# Patient Record
Sex: Male | Born: 1935 | Race: White | Hispanic: No | Marital: Married | Smoking: Never smoker
Health system: Southern US, Community
[De-identification: ages and names within clinical notes are randomized; demographics above are authoritative.]

## PROBLEM LIST (undated history)

## (undated) DIAGNOSIS — N400 Enlarged prostate without lower urinary tract symptoms: Secondary | ICD-10-CM

## (undated) DIAGNOSIS — M1990 Unspecified osteoarthritis, unspecified site: Secondary | ICD-10-CM

## (undated) HISTORY — PX: EYE SURGERY: SHX253

---

## 1991-07-08 HISTORY — PX: AXILLARY LYMPH NODE BIOPSY: SHX5737

## 1998-11-18 ENCOUNTER — Emergency Department (HOSPITAL_COMMUNITY): Admission: EM | Admit: 1998-11-18 | Discharge: 1998-11-18 | Payer: Self-pay | Admitting: Emergency Medicine

## 2003-12-08 ENCOUNTER — Encounter: Admission: RE | Admit: 2003-12-08 | Discharge: 2003-12-08 | Payer: Self-pay | Admitting: Internal Medicine

## 2005-05-27 ENCOUNTER — Ambulatory Visit: Payer: Self-pay | Admitting: Gastroenterology

## 2005-06-04 ENCOUNTER — Ambulatory Visit: Payer: Self-pay | Admitting: Gastroenterology

## 2005-06-18 ENCOUNTER — Ambulatory Visit: Payer: Self-pay | Admitting: Gastroenterology

## 2006-05-08 ENCOUNTER — Ambulatory Visit (HOSPITAL_COMMUNITY): Admission: RE | Admit: 2006-05-08 | Discharge: 2006-05-08 | Payer: Self-pay | Admitting: Neurosurgery

## 2007-02-15 ENCOUNTER — Emergency Department (HOSPITAL_COMMUNITY): Admission: EM | Admit: 2007-02-15 | Discharge: 2007-02-15 | Payer: Self-pay | Admitting: Emergency Medicine

## 2008-06-14 ENCOUNTER — Encounter (INDEPENDENT_AMBULATORY_CARE_PROVIDER_SITE_OTHER): Payer: Self-pay | Admitting: Family Medicine

## 2008-06-14 ENCOUNTER — Ambulatory Visit: Payer: Self-pay | Admitting: Sports Medicine

## 2008-06-14 DIAGNOSIS — M7660 Achilles tendinitis, unspecified leg: Secondary | ICD-10-CM | POA: Insufficient documentation

## 2008-07-14 ENCOUNTER — Ambulatory Visit: Payer: Self-pay | Admitting: Sports Medicine

## 2008-09-07 ENCOUNTER — Ambulatory Visit: Payer: Self-pay | Admitting: Sports Medicine

## 2009-08-24 ENCOUNTER — Encounter: Admission: RE | Admit: 2009-08-24 | Discharge: 2009-08-24 | Payer: Self-pay | Admitting: Internal Medicine

## 2010-01-01 ENCOUNTER — Ambulatory Visit: Payer: Self-pay | Admitting: Family Medicine

## 2010-01-01 DIAGNOSIS — M79609 Pain in unspecified limb: Secondary | ICD-10-CM

## 2010-01-02 ENCOUNTER — Telehealth: Payer: Self-pay | Admitting: Family Medicine

## 2010-01-04 ENCOUNTER — Telehealth: Payer: Self-pay | Admitting: Family Medicine

## 2010-02-26 ENCOUNTER — Encounter: Admission: RE | Admit: 2010-02-26 | Discharge: 2010-02-26 | Payer: Self-pay | Admitting: Internal Medicine

## 2010-08-08 NOTE — Assessment & Plan Note (Signed)
Summary: Chad Gallegos,Chad Gallegos   Vital Signs:  Patient profile:   77 year old male BP sitting:   112 / 68  Vitals Entered By: Lillia Pauls CMA (January 01, 2010 2:54 PM)  History of Present Illness: patient is a very pleasant,, very active 51 room male, who currently is biking extensively, and also recently ran a half marathon several months ago.  He is a very fit individual, but now he complains primarily of left greater than right distal shin Gallegos.  This does not bother him when he is running, and also does not bother him when he is riding a bicycle. Essentially, this only bothers him when he is wearing his hiking boots. He also has recently bought a new para-boots, but noticed it more when he was wearing his old parrotlike induced, which he is had for approximately 10 years.  He has an upcoming significant trip in August going to [place] with his wife, and they're to be extensively hiking. He was recommended to go in to get some hiking boots for this trip.  REVIEW OF SYSTEMS  GEN: No systemic complaints, no fevers, chills, sweats, or other acute illnesses MSK: Detailed in the HPI GI: tolerating PO intake without difficulty Neuro: No numbness, parasthesias, or tingling associated. Otherwise the pertinent positives of the ROS are noted above.    Past History:  Past medical, surgical, family and social histories (including risk factors) reviewed, and no changes noted (except as noted below).  Past Medical History: history of Achilles tendinopathy, right  Family History: Reviewed history and no changes required.  Social History: Reviewed history and no changes required. married, retired Research officer, trade union professor [employer]  Physical Exam  General:  Well-developed,well-nourished,in no acute distress; alert,appropriate and cooperative throughout examination Head:  normocephalic and atraumatic.   Ears:  no external deformities.   Nose:  no external deformity.   Lungs:  normal respiratory effort.   Msk:   bilateral lower extremities: There is some palpable fullness, left greater than right in the distal tibia, just at the area proximal to  where his boot  with hand. This is tender to palpation and tender to percussion.  Hot test on the left is mildly tender, but he is able to complete a full 10 hops.  Negative Kluger test, negative squeeze test.  Nontender at the bilateral malleoli, minimally tender at the  right Achilles tendon.  Otherwise grossly the foot and ankle is normal.  He is able to dorsiflex and plantar flex as well as invert and evert his foot without any difficulty in both feet.   Impression & Recommendations:  Problem # 1:  LEG Gallegos, LEFT (ICD-729.5) Assessment New ultrasound evaluation, left leg: Mosca skeletal ultrasound probe, GE, logic E., was used to evaluate, and there was some evidence of some tissue swelling and edema present with fluid  adjacent to the bone and in the soft tissue adjacent. There was no evidence of cortical disruption.   Given clinical and radiological findings, it does make some sense that this could be a low-grade stress reaction,  accentuated by transmitted force to the tibia itself, after the patient  had increased  tensile demands due to  his hiking boots.  They less likely this could all be soft tissue irritation, however, think that is less likely.  Recommended icing and Voltaren gel. I think he can continue to bite and run as tolerated.  Recommended that he get a lower top  hiking shoe, and they do reasonably be able to  do  virtually any hiking,, since he is a highly conditioned athlete  Orders: Prescription Created Electronically [phone]) [place] LIMITED [phone])  Problem # 2:  LEG Gallegos, RIGHT (ICD-729.5) Assessment: New  Orders: [place] LIMITED ([id number])  Complete Medication List: 1)  Voltaren 1 % Gel (Diclofenac sodium) .... Apply to affected area 3-4 times daily. dispense qs 1 month Prescriptions: VOLTAREN 1 % GEL (DICLOFENAC SODIUM) apply to  affected area 3-4 times daily. dispense qs 1 month  #1 x 1   Entered and Authorized by:   Hannah Beat MD   Signed by:   Hannah Beat MD on 01/01/2010   Method used:   [facility] to        [facility]  [address] [phone]* (retail)       [address]       [place], [place]  [id number]       Ph: [phone] or [phone]       Fax: [phone]   RxID:   [id number]

## 2010-08-08 NOTE — Progress Notes (Signed)
Summary: prior auth denied for androgel  Phone Note From Pharmacy   Caller: [facility] Summary of Call: Prior auth denied for voltaren gel, letter is on your desk. Initial call taken by: Lowella Petties CMA,  January 04, 2010 5:10 PM  Follow-up for Phone Call        Can you call Chad Gallegos -- his Voltaren gel got denied by his insurance. One of the compounding pharmacies like [facility] can make some up, usually for around 40 dollars cash price if he would like. This is up to him. And we can call in.  [facility] patient. Follow-up by: Hannah Beat MD,  January 05, 2010 10:58 AM  Additional Follow-up for Phone Call Additional follow up Details #1::        left pt a mssg to call us back with the option of his choice Additional Follow-up by: Lillia Pauls CMA,  January 09, 2010 10:03 AM

## 2010-08-08 NOTE — Progress Notes (Signed)
Summary: prior Berkley Harvey is needed for voltaren gel  Phone Note From Pharmacy   [facility] [address] [employer] [facility] Call: Prior Berkley Harvey is needed for voltaren gel, form is on your desk. Initial call taken by: Lowella Petties CMA,  January 02, 2010 9:39 AM  Follow-up for Phone Call        done Follow-up by: Hannah Beat MD,  January 02, 2010 9:41 AM

## 2012-02-16 ENCOUNTER — Other Ambulatory Visit: Payer: Self-pay | Admitting: Internal Medicine

## 2012-02-16 DIAGNOSIS — N644 Mastodynia: Secondary | ICD-10-CM

## 2012-02-20 ENCOUNTER — Ambulatory Visit
Admission: RE | Admit: 2012-02-20 | Discharge: 2012-02-20 | Disposition: A | Discharge status: Home / Self Care | Payer: Medicare Other | Source: Ambulatory Visit | Attending: Internal Medicine | Admitting: Internal Medicine

## 2012-02-20 DIAGNOSIS — N644 Mastodynia: Secondary | ICD-10-CM

## 2012-08-27 ENCOUNTER — Other Ambulatory Visit: Payer: Self-pay | Admitting: Neurosurgery

## 2012-08-27 DIAGNOSIS — M5030 Other cervical disc degeneration, unspecified cervical region: Secondary | ICD-10-CM

## 2012-09-01 ENCOUNTER — Ambulatory Visit
Admission: RE | Admit: 2012-09-01 | Discharge: 2012-09-01 | Disposition: A | Discharge status: Home / Self Care | Payer: 59 | Source: Ambulatory Visit | Attending: Neurosurgery | Admitting: Neurosurgery

## 2013-09-29 ENCOUNTER — Encounter (HOSPITAL_COMMUNITY): Payer: Self-pay | Admitting: Emergency Medicine

## 2013-09-29 ENCOUNTER — Emergency Department (HOSPITAL_COMMUNITY)
Admission: EM | Admit: 2013-09-29 | Discharge: 2013-09-29 | Disposition: A | Discharge status: Home / Self Care | Payer: Medicare Other | Source: Home / Self Care | Attending: Family Medicine | Admitting: Family Medicine

## 2013-09-29 DIAGNOSIS — T148 Other injury of unspecified body region: Secondary | ICD-10-CM

## 2013-09-29 DIAGNOSIS — W57XXXA Bitten or stung by nonvenomous insect and other nonvenomous arthropods, initial encounter: Secondary | ICD-10-CM

## 2013-09-29 MED ORDER — DOXYCYCLINE HYCLATE 100 MG PO CAPS: 100.0000 mg | ORAL_CAPSULE | Freq: Two times a day (BID) | ORAL | Status: DC

## 2013-09-29 NOTE — ED Notes (Signed)
C/o insect bite under left arm which was noticed Monday 3/23    Cream used but no relief.   No drainage of pus or blood

## 2013-09-29 NOTE — ED Provider Notes (Signed)
Chad Gallegos is a 80 y.o. male who presents to Urgent Care today for papule in left axilla. Present for the last 2-3 days. Patient was gardening on Monday and suspects he was bitten by some sort of insect. Somewhat itchy. He has tried [employer] Food[employer] Bond Itch which has helped a bit. No fevers chills nausea vomiting or diarrhea.   History reviewed. No pertinent past medical history. History  Substance Use Topics  . Smoking status: Not on file  . Smokeless tobacco: Not on file  . Alcohol Use: Not on file   ROS as above Medications: No current facility-administered medications for this encounter.   Current Outpatient Prescriptions  Medication Sig Dispense Refill  . diclofenac sodium (VOLTAREN) 1 % GEL Apply topically. To affected area 3-4 times daily.  Disp qs 1 month.       . doxycycline (VIBRAMYCIN) 100 MG capsule Take 1 capsule (100 mg total) by mouth 2 (two) times daily.  20 capsule  0    Exam:  BP 123/89  Pulse 61  Temp(Src) 98.2 F (36.8 C) (Oral)  Resp 20  SpO2 97% Gen: Well NAD Skin: Penny-sized erythematous papule with black center mildly tender left axilla. No lymphadenopathies induration or other erythema.   Assessment and Plan: 80 y.o. male with possible tick bite versus folliculitis. Plan with doxycycline. Followup as needed.  Discussed warning signs or symptoms. Please see discharge instructions. Patient expresses understanding.    Rodolph BongEvan S Deva Ron, MD 09/29/13 [phone]

## 2013-09-29 NOTE — Discharge Instructions (Signed)
Thank you for coming in today. Doxycycline twice daily for 10 days. Continue Gold Bond Itch and hydrocortisone cream. Followup with Dr. Dallas Schimke as needed Use sunscreen while active this weekend Tick Bite Information Ticks are insects that attach themselves to the skin and draw blood for food. There are various types of ticks. Common types include wood ticks and deer ticks. Most ticks live in shrubs and grassy areas. Ticks can climb onto your body when you make contact with leaves or grass where the tick is waiting. The most common places on the body for ticks to attach themselves are the scalp, neck, armpits, waist, and groin. Most tick bites are harmless, but sometimes ticks carry germs that cause diseases. These germs can be spread to a person during the tick's feeding process. The chance of a disease spreading through a tick bite depends on:   The type of tick.  Time of year.   How long the tick is attached.   Geographic location.  HOW CAN YOU PREVENT TICK BITES? Take these steps to help prevent tick bites when you are outdoors:  Wear protective clothing. Long sleeves and long pants are best.   Wear white clothes so you can see ticks more easily.  Tuck your pant legs into your socks.   If walking on a trail, stay in the middle of the trail to avoid brushing against bushes.  Avoid walking through areas with long grass.  Put insect repellent on all exposed skin and along boot tops, pant legs, and sleeve cuffs.   Check clothing, hair, and skin repeatedly and before going inside.   Brush off any ticks that are not attached.  Take a shower or bath as soon as possible after being outdoors.  WHAT IS THE PROPER WAY TO REMOVE A TICK? Ticks should be removed as soon as possible to help prevent diseases caused by tick bites. 1. If latex gloves are available, put them on before trying to remove a tick.  2. Using fine-point tweezers, grasp the tick as close to the skin as  possible. You may also use curved forceps or a tick removal tool. Grasp the tick as close to its head as possible. Avoid grasping the tick on its body. 3. Pull gently with steady upward pressure until the tick lets go. Do not twist the tick or jerk it suddenly. This may break off the tick's head or mouth parts. 4. Do not squeeze or crush the tick's body. This could force disease-carrying fluids from the tick into your body.  5. After the tick is removed, wash the bite area and your hands with soap and water or other disinfectant such as alcohol. 6. Apply a small amount of antiseptic cream or ointment to the bite site.  7. Wash and disinfect any instruments that were used.  Do not try to remove a tick by applying a hot match, petroleum jelly, or fingernail polish to the tick. These methods do not work and may increase the chances of disease being spread from the tick bite.  WHEN SHOULD YOU SEEK MEDICAL CARE? Contact your health care provider if you are unable to remove a tick from your skin or if a part of the tick breaks off and is stuck in the skin.  After a tick bite, you need to be aware of signs and symptoms that could be related to diseases spread by ticks. Contact your health care provider if you develop any of the following in the days or weeks after  the tick bite:  Unexplained fever.  Rash. A circular rash that appears days or weeks after the tick bite may indicate the possibility of Lyme disease. The rash may resemble a target with a bull's-eye and may occur at a different part of your body than the tick bite.  Redness and swelling in the area of the tick bite.   Tender, swollen lymph glands.   Diarrhea.   Weight loss.   Cough.   Fatigue.   Muscle, joint, or bone pain.   Abdominal pain.   Headache.   Lethargy or a change in your level of consciousness.  Difficulty walking or moving your legs.   Numbness in the legs.   Paralysis.  Shortness of breath.    Confusion.   Repeated vomiting.  Document Released: 06/20/2000 Document Revised: 04/13/2013 Document Reviewed: 12/01/2012 [facility] Patient Information 2014 [employer], [place].

## 2014-01-11 ENCOUNTER — Emergency Department (INDEPENDENT_AMBULATORY_CARE_PROVIDER_SITE_OTHER): Payer: Medicare Other

## 2014-01-11 ENCOUNTER — Emergency Department (HOSPITAL_COMMUNITY)
Admission: EM | Admit: 2014-01-11 | Discharge: 2014-01-11 | Disposition: A | Discharge status: Home / Self Care | Payer: Medicare Other | Source: Home / Self Care | Attending: Emergency Medicine | Admitting: Emergency Medicine

## 2014-01-11 ENCOUNTER — Encounter (HOSPITAL_COMMUNITY): Payer: Self-pay | Admitting: Emergency Medicine

## 2014-01-11 DIAGNOSIS — T148XXA Other injury of unspecified body region, initial encounter: Secondary | ICD-10-CM

## 2014-01-11 DIAGNOSIS — IMO0002 Reserved for concepts with insufficient information to code with codable children: Secondary | ICD-10-CM

## 2014-01-11 DIAGNOSIS — X58XXXA Exposure to other specified factors, initial encounter: Secondary | ICD-10-CM

## 2014-01-11 HISTORY — DX: Benign prostatic hyperplasia without lower urinary tract symptoms: N40.0

## 2014-01-11 NOTE — ED Notes (Signed)
Laceration to the back of left knee.  Reports being in the yard, unsure what caused the wound, patient only noticed blood as first indication of an injury.  Patient has guaze to wound and guaze taped to wound, bleeding through dressing.  Patient denies any blood squirting from wound.  Tetanus 2 years ago.  Pedal pulse 2 plus, denies numbness or tingling.  Reports injury occurred 3-4 hours ago and will not stop bleeding.

## 2014-01-11 NOTE — ED Provider Notes (Signed)
  Chief Complaint   Chief Complaint  Patient presents with  . Laceration    History of Present Illness   Chad Gallegos is a 80 year old male who was out doing some yard work this afternoon when he got scratched in the left popliteal fossa by something, he's not sure what it was. It could of been a piece of vegetation or metal. No one was one long nearby and he does not think it's foreign body. He's had trouble getting it to stop bleeding. This bled profusely and he brings a whole bag full of blood soaked rags and gauze along with him.  Review of Systems   Other than as noted above, the patient denies any of the following symptoms: Musculoskeletal:  No joint pain or decreased range of motion. Neuro:  No numbness, tingling, or weakness.  PMFSH   Past medical history, family history, social history, meds, and allergies were reviewed.   Physical Examination     Vital signs:  BP 119/79  Pulse 61  Temp(Src) 97.3 F (36.3 C) (Oral)  Resp 18  SpO2 99% Ext:  There is a 1 cm laceration in the left popliteal fossa appears bleeding profusely. There is no obvious foreign body.  All other joints had a full ROM without pain.  Pulses were full.  Good capillary refill in all digits.  No edema. Neurological:  Alert and oriented.  No muscle weakness.  Sensation was intact to light touch.   Radiology   Dg Knee Complete 4 Views Left  01/11/2014   CLINICAL DATA:  Pain post trauma  EXAM: LEFT KNEE - COMPLETE 4+ VIEW  COMPARISON:  None.  FINDINGS: Frontal, lateral, and bilateral oblique views were obtained. No fracture or dislocation. No effusion. Joint spaces appear intact. No erosive change. No radiopaque foreign body.  IMPRESSION: No abnormality noted.   Electronically Signed   By: Bretta BangWilliam  Woodruff M.D.   On: 01/11/2014 15:13    I reviewed the images independently and personally and concur with the radiologist's findings.  Procedure Note:  Verbal informed consent was obtained.  The patient was  informed of the risks and benefits of the procedure and understands and accepts.  A time out was called and the identity of the patient and correct procedure were confirmed.   The laceration area described above was prepped with Betadine and saline  and anesthetized with 5 mL of 2% Xylocaine with epinephrine.  The wound was then closed as follows:  Initially, stable closure was tried, but it continued to bleed. Thereafter the wound was closed with 3 4-0 Prolene sutures and the bleeding stopped.  There were no immediate complications, and the patient tolerated the procedure well. The laceration was then cleansed, Bacitracin ointment was applied and a clean, dry pressure dressing was put on.    Assessment   The encounter diagnosis was Laceration.  Plan   1.  Meds:  The following meds were prescribed:   New Prescriptions   No medications on file    2.  Patient Education/Counseling:  The patient was given appropriate handouts, self care instructions, and instructed in symptomatic relief. Instructions were given for wound care.    3.  Follow up:  The patient was told to follow up immediately if there is any sign of infection.The patient will return in 10 days for suture removal.      Reuben Likesavid C Jasmane Brockway, MD 01/11/14 [phone]

## 2014-01-11 NOTE — Discharge Instructions (Signed)

## 2014-01-20 ENCOUNTER — Emergency Department (INDEPENDENT_AMBULATORY_CARE_PROVIDER_SITE_OTHER)
Admission: EM | Admit: 2014-01-20 | Discharge: 2014-01-20 | Disposition: A | Discharge status: Home / Self Care | Payer: Medicare Other | Source: Home / Self Care | Attending: Family Medicine | Admitting: Family Medicine

## 2014-01-20 ENCOUNTER — Encounter (HOSPITAL_COMMUNITY): Payer: Self-pay | Admitting: Emergency Medicine

## 2014-01-20 DIAGNOSIS — Z4802 Encounter for removal of sutures: Secondary | ICD-10-CM

## 2014-01-20 NOTE — ED Notes (Signed)
Pt  Here  For  Suture removal      Verbalizes  No  Complaints  Tet  utd

## 2014-01-20 NOTE — Discharge Instructions (Signed)

## 2014-01-20 NOTE — ED Provider Notes (Signed)
CSN: [id number]     Arrival date & time 01/20/14  [id number] History   First MD Initiated Contact with Patient 01/20/14 1008     Chief Complaint  Patient presents with  . Suture / Staple Removal   (Consider location/radiation/quality/duration/timing/severity/associated sxs/prior Treatment) HPI Comments: Patient presents for suture removal. No current complaints, no signs of infection. Wound seems to be healing appropriately.  Patient is a 80 y.o. male presenting with suture removal.  Suture / Staple Removal    Past Medical History  Diagnosis Date  . Prostate hypertrophy    History reviewed. No pertinent past surgical history. History reviewed. No pertinent family history. History  Substance Use Topics  . Smoking status: Never Smoker   . Smokeless tobacco: Not on file  . Alcohol Use: No    Review of Systems  Skin: Positive for wound.  All other systems reviewed and are negative.   Allergies  Review of patient's allergies indicates no known allergies.  Home Medications   Prior to Admission medications   Medication Sig Start Date End Date Taking? Authorizing Provider  aspirin (ASPIRIN EC) 81 MG EC tablet Take 81 mg by mouth daily. Swallow whole.    Historical Provider, MD  diclofenac sodium (VOLTAREN) 1 % GEL Apply topically. To affected area 3-4 times daily.  Disp qs 1 month.     Historical Provider, MD  doxycycline (VIBRAMYCIN) 100 MG capsule Take 1 capsule (100 mg total) by mouth 2 (two) times daily. 09/29/13   Rodolph BongEvan S Corey, MD  OVER THE COUNTER MEDICATION Over the counter vitamins:b,d    Historical Provider, MD   BP 129/82  Pulse 62  Temp(Src) 98.2 F (36.8 C) (Oral)  Resp 18  SpO2 98% Physical Exam  Nursing note and vitals reviewed. Constitutional: He is oriented to person, place, and time. He appears well-developed and well-nourished. No distress.  HENT:  Head: Normocephalic.  Pulmonary/Chest: Effort normal. No respiratory distress.  Neurological: He is alert and  oriented to person, place, and time. Coordination normal.  Skin: Skin is warm and dry. Laceration (left popliteal fossa, there is a laceration, sutured, no redness or swelling, no signs of wound infection.) noted. No rash noted. He is not diaphoretic.  Psychiatric: He has a normal mood and affect. Judgment normal.    ED Course  Procedures (including critical care time) Labs Review Labs Reviewed - No data to display  Imaging Review No results found.   MDM   1. Visit for suture removal    Sutures removed. Keep clean and covered until completely healed. Followup as needed    Graylon GoodZachary H Shylyn Younce, PA-C 01/20/14 1123

## 2014-01-22 NOTE — ED Provider Notes (Signed)
Medical screening examination/treatment/procedure(s) were performed by a resident physician or non-physician practitioner and as the supervising physician I was immediately available for consultation/collaboration.  Orrie Lascano, MD    Keisi Eckford S Arryanna Holquin, MD 01/22/14 0846 

## 2015-07-27 ENCOUNTER — Encounter: Payer: Self-pay | Admitting: Gastroenterology

## 2016-08-05 ENCOUNTER — Ambulatory Visit (INDEPENDENT_AMBULATORY_CARE_PROVIDER_SITE_OTHER): Payer: Medicare Other | Admitting: Sports Medicine

## 2016-08-05 DIAGNOSIS — R269 Unspecified abnormalities of gait and mobility: Secondary | ICD-10-CM | POA: Insufficient documentation

## 2016-08-05 DIAGNOSIS — M21621 Bunionette of right foot: Secondary | ICD-10-CM

## 2016-08-05 NOTE — Assessment & Plan Note (Signed)
Sports insoles are given Scaphoid pad for arch support Small metatarsal pad on the right for transverse arch

## 2016-08-05 NOTE — Assessment & Plan Note (Signed)
After placement of sports insoles with arch support he was able to cut down on his turnout by 50%  His running gait looked efficient  He had less pain on the bunionette  Try this for the next one to 2 months and see how he does

## 2016-08-05 NOTE — Progress Notes (Signed)
CC: RT foot pain   Patient with several months of right foot pain He has developed a bunionette on the right foot Wider shoes have helped somewhat He also notes that his right shoe is wearing out on the inside at the great toe Some occasional pain across his right forefoot  This past year he was able to run a half marathon He continues running 3-4 days per week  Review of systems No swelling of his first MTP joint No numbness in his feet  Physical examination Athletic older male in no acute distress BP (!) 133/53   Ht 5\' 8"  (1.727 m)   Wt 150 lb (68 kg)   BMI 22.81 kg/m   Right foot shows widening of the forefoot Bunionette with some subluxation Loss of transverse arch Moderately well-preserved longitudinal arch  Mild tenderness to palpation over the left fifth MTP  Running gait reveals 30 of turnout of the right foot and 20 with a left foot This causes a dynamic forefoot pronation and some compensatory supination Running shoes shows a marked wear pattern over the right first toe

## 2018-02-25 ENCOUNTER — Encounter: Payer: Self-pay | Admitting: Sports Medicine

## 2018-02-25 ENCOUNTER — Ambulatory Visit: Payer: Medicare Other | Admitting: Sports Medicine

## 2018-02-25 VITALS — BP 130/72 | Ht 67.0 in | Wt 152.0 lb

## 2018-02-25 DIAGNOSIS — M7662 Achilles tendinitis, left leg: Secondary | ICD-10-CM

## 2018-02-25 MED ORDER — NITROGLYCERIN 0.2 MG/HR TD PT24: MEDICATED_PATCH | TRANSDERMAL | 1 refills | Status: DC

## 2018-02-25 NOTE — Progress Notes (Signed)
  Chad Gallegos - 84 y.o. male MRN [id number]  Date of birth: 05/26/1934    SUBJECTIVE:      Chief Complaint:/ HPI:  The patient is an active 84 year old male who presents with 2 complaints:  1) patient reports having left Achilles pain for the past 4 to 6 weeks.  He denies any specific injury and has been slowly worsening.  Patient previously was running several miles per week but has since discontinued.  He now has been walking using stationary bike.  His pain is worse with this activity and begins to be bothersome at about 1 to 2 miles of walking.  He has been stretching before walking.  Using ice regularly.  He wears insoles and heel cups.  2) patient also has posterior right hip pain around the area the proximal hamstring.  This is also been ongoing for about 4 to 6 weeks.  He denies any specific injury but believes it started while hiking.  Pain is worse with ambulation and when flexing his knee against resistance.  He denies any numbness or tingling in the extremity   ROS:     See HPI  PERTINENT  PMH / PSH FH / / SH:  Past Medical, Surgical, Social, and Family History Reviewed & Updated in the EMR.    OBJECTIVE: BP 130/72   Ht 5\' 7"  (1.702 m)   Wt 152 lb (68.9 kg)   BMI 23.81 kg/m   Physical Exam:  Vital signs are reviewed.  GEN: Alert and oriented, NAD Pulm: Breathing unlabored PSY: normal mood, congruent affect  MSK: Left ankle: - Inspection: Mild swelling over the left Achilles midsubstance.  No erythema or bruising - Palpation: Tenderness over the mid Achilles tendon.  No tenderness at the insertion on the calcaneus.  No tenderness at the musculotendinous junction - Strength: Normal strength with dorsiflexion, plantarflexion, inversion, and eversion.  No pain with toe raise or plantarflexion - ROM: Full ROM - Neuro/vasc: NV intact US: Limited ultrasound evaluation of the left Achilles tendon revealed significant hypoechoic changes within the tendon as well as obvious  thickening.  Its insertion on the calcaneus appears normal.  Right hip:  - Inspection: No gross deformity or asymmetry - Palpation: Tenderness with palpation over the right issue tuberosity - ROM: Normal range of motion on Flexion, extension, abduction, adduction, internal and external rotation - Strength: Normal strength.  Mild pain at the proximal hamstring insertion with resisted knee flexion - Special Tests: Negative FABER and FADIR.  Negative Trendelenberg. US: Limited ultrasound evaluation of the proximal hamstrings insertion on the initial tuberosity is fairly unremarkable.  No obvious tendinopathy.  There is a small calcification at the insertion site seen.   ASSESSMENT & PLAN:  1.  Left Achilles tendinopathy-obvious degenerative changes seen on ultrasound.  No tendon rupture. - Recommend continued limited activity based on pain. -Patient provided education on eccentric heel drops -Continue Voltaren gel 3-4 times per day -Will treat with nitroglycerin patches -Patient will follow-up in 6 weeks -If no improvement at that time consider referral for formal physical therapy   2.  Right proximal hamstring tendinopathy-mild pain that overall has been improving since onset about 6 weeks ago.  Ultrasound evaluation fairly unremarkable.  Patient instructed on hamstring exercises to perform.  We will also reevaluate this when he follows up in 6 weeks.

## 2018-02-25 NOTE — Patient Instructions (Addendum)
You have a tendinopathy of your left Achilles tendon and your right hamstring where it inserts at the pelvis. For the Achilles, continue to use the Voltaren gel 3-4 times per day May also use Tylenol as needed for pain Performed the exercises he was shown today at least once daily. Recommend using ice after activity to help with inflammation and pain. For your Achilles tendon, we will treat you with topical nitroglycerin patches.  Follow the instructions below for use of the nitroglycerin patch We will see you back in 6 weeks for follow-up.  Nitroglycerin Protocol   Apply 1/4 nitroglycerin patch to affected area daily.  Change position of patch within the affected area every 24 hours.  You may experience a headache during the first 1-2 weeks of using the patch, these should subside.  If you experience headaches after beginning nitroglycerin patch treatment, you may take your preferred over the counter pain reliever.  Another side effect of the nitroglycerin patch is skin irritation or rash related to patch adhesive.  Please notify our office if you develop more severe headaches or rash, and stop the patch.  Tendon healing with nitroglycerin patch may require 12 to 24 weeks depending on the extent of injury.  Men should not use if taking Viagra, Cialis, or Levitra.   Do not use if you have migraines or rosacea.

## 2020-08-22 DIAGNOSIS — L821 Other seborrheic keratosis: Secondary | ICD-10-CM | POA: Diagnosis not present

## 2020-08-22 DIAGNOSIS — B351 Tinea unguium: Secondary | ICD-10-CM | POA: Diagnosis not present

## 2020-08-22 DIAGNOSIS — D2239 Melanocytic nevi of other parts of face: Secondary | ICD-10-CM | POA: Diagnosis not present

## 2020-08-22 DIAGNOSIS — D2371 Other benign neoplasm of skin of right lower limb, including hip: Secondary | ICD-10-CM | POA: Diagnosis not present

## 2020-08-22 DIAGNOSIS — L814 Other melanin hyperpigmentation: Secondary | ICD-10-CM | POA: Diagnosis not present

## 2020-08-22 DIAGNOSIS — L720 Epidermal cyst: Secondary | ICD-10-CM | POA: Diagnosis not present

## 2020-08-22 DIAGNOSIS — L570 Actinic keratosis: Secondary | ICD-10-CM | POA: Diagnosis not present

## 2020-08-22 DIAGNOSIS — L7211 Pilar cyst: Secondary | ICD-10-CM | POA: Diagnosis not present

## 2020-08-22 DIAGNOSIS — D1801 Hemangioma of skin and subcutaneous tissue: Secondary | ICD-10-CM | POA: Diagnosis not present

## 2020-10-22 DIAGNOSIS — M5412 Radiculopathy, cervical region: Secondary | ICD-10-CM | POA: Diagnosis not present

## 2020-11-06 DIAGNOSIS — M5412 Radiculopathy, cervical region: Secondary | ICD-10-CM | POA: Diagnosis not present

## 2020-11-06 DIAGNOSIS — M542 Cervicalgia: Secondary | ICD-10-CM | POA: Diagnosis not present

## 2020-11-19 DIAGNOSIS — R030 Elevated blood-pressure reading, without diagnosis of hypertension: Secondary | ICD-10-CM | POA: Diagnosis not present

## 2020-11-19 DIAGNOSIS — M5412 Radiculopathy, cervical region: Secondary | ICD-10-CM | POA: Diagnosis not present

## 2021-02-11 DIAGNOSIS — M25571 Pain in right ankle and joints of right foot: Secondary | ICD-10-CM | POA: Diagnosis not present

## 2021-02-20 DIAGNOSIS — R030 Elevated blood-pressure reading, without diagnosis of hypertension: Secondary | ICD-10-CM | POA: Diagnosis not present

## 2021-02-20 DIAGNOSIS — Z87891 Personal history of nicotine dependence: Secondary | ICD-10-CM | POA: Diagnosis not present

## 2021-02-20 DIAGNOSIS — R32 Unspecified urinary incontinence: Secondary | ICD-10-CM | POA: Diagnosis not present

## 2021-02-20 DIAGNOSIS — Z833 Family history of diabetes mellitus: Secondary | ICD-10-CM | POA: Diagnosis not present

## 2021-02-20 DIAGNOSIS — Z809 Family history of malignant neoplasm, unspecified: Secondary | ICD-10-CM | POA: Diagnosis not present

## 2021-02-20 DIAGNOSIS — N529 Male erectile dysfunction, unspecified: Secondary | ICD-10-CM | POA: Diagnosis not present

## 2021-02-20 DIAGNOSIS — N400 Enlarged prostate without lower urinary tract symptoms: Secondary | ICD-10-CM | POA: Diagnosis not present

## 2021-02-20 DIAGNOSIS — Z791 Long term (current) use of non-steroidal anti-inflammatories (NSAID): Secondary | ICD-10-CM | POA: Diagnosis not present

## 2021-04-17 DIAGNOSIS — Z961 Presence of intraocular lens: Secondary | ICD-10-CM | POA: Diagnosis not present

## 2021-04-17 DIAGNOSIS — H31001 Unspecified chorioretinal scars, right eye: Secondary | ICD-10-CM | POA: Diagnosis not present

## 2021-04-17 DIAGNOSIS — H43813 Vitreous degeneration, bilateral: Secondary | ICD-10-CM | POA: Diagnosis not present

## 2021-04-17 DIAGNOSIS — H52203 Unspecified astigmatism, bilateral: Secondary | ICD-10-CM | POA: Diagnosis not present

## 2021-05-15 DIAGNOSIS — R030 Elevated blood-pressure reading, without diagnosis of hypertension: Secondary | ICD-10-CM | POA: Diagnosis not present

## 2021-05-15 DIAGNOSIS — M5412 Radiculopathy, cervical region: Secondary | ICD-10-CM | POA: Diagnosis not present

## 2021-05-22 DIAGNOSIS — L308 Other specified dermatitis: Secondary | ICD-10-CM | POA: Diagnosis not present

## 2021-05-22 DIAGNOSIS — D485 Neoplasm of uncertain behavior of skin: Secondary | ICD-10-CM | POA: Diagnosis not present

## 2021-05-22 DIAGNOSIS — L570 Actinic keratosis: Secondary | ICD-10-CM | POA: Diagnosis not present

## 2021-05-23 DIAGNOSIS — M6281 Muscle weakness (generalized): Secondary | ICD-10-CM | POA: Diagnosis not present

## 2021-05-23 DIAGNOSIS — M5412 Radiculopathy, cervical region: Secondary | ICD-10-CM | POA: Diagnosis not present

## 2021-05-23 DIAGNOSIS — M542 Cervicalgia: Secondary | ICD-10-CM | POA: Diagnosis not present

## 2021-05-23 DIAGNOSIS — M4802 Spinal stenosis, cervical region: Secondary | ICD-10-CM | POA: Diagnosis not present

## 2021-05-23 DIAGNOSIS — R293 Abnormal posture: Secondary | ICD-10-CM | POA: Diagnosis not present

## 2021-05-28 DIAGNOSIS — M4802 Spinal stenosis, cervical region: Secondary | ICD-10-CM | POA: Diagnosis not present

## 2021-05-28 DIAGNOSIS — M542 Cervicalgia: Secondary | ICD-10-CM | POA: Diagnosis not present

## 2021-05-28 DIAGNOSIS — R293 Abnormal posture: Secondary | ICD-10-CM | POA: Diagnosis not present

## 2021-05-28 DIAGNOSIS — M5412 Radiculopathy, cervical region: Secondary | ICD-10-CM | POA: Diagnosis not present

## 2021-05-28 DIAGNOSIS — M6281 Muscle weakness (generalized): Secondary | ICD-10-CM | POA: Diagnosis not present

## 2021-06-04 DIAGNOSIS — M6281 Muscle weakness (generalized): Secondary | ICD-10-CM | POA: Diagnosis not present

## 2021-06-04 DIAGNOSIS — R293 Abnormal posture: Secondary | ICD-10-CM | POA: Diagnosis not present

## 2021-06-04 DIAGNOSIS — M542 Cervicalgia: Secondary | ICD-10-CM | POA: Diagnosis not present

## 2021-06-04 DIAGNOSIS — M5412 Radiculopathy, cervical region: Secondary | ICD-10-CM | POA: Diagnosis not present

## 2021-06-04 DIAGNOSIS — M4802 Spinal stenosis, cervical region: Secondary | ICD-10-CM | POA: Diagnosis not present

## 2021-06-11 DIAGNOSIS — M4802 Spinal stenosis, cervical region: Secondary | ICD-10-CM | POA: Diagnosis not present

## 2021-06-11 DIAGNOSIS — M5412 Radiculopathy, cervical region: Secondary | ICD-10-CM | POA: Diagnosis not present

## 2021-06-11 DIAGNOSIS — M6281 Muscle weakness (generalized): Secondary | ICD-10-CM | POA: Diagnosis not present

## 2021-06-11 DIAGNOSIS — R293 Abnormal posture: Secondary | ICD-10-CM | POA: Diagnosis not present

## 2021-06-11 DIAGNOSIS — M542 Cervicalgia: Secondary | ICD-10-CM | POA: Diagnosis not present

## 2021-06-24 DIAGNOSIS — M25551 Pain in right hip: Secondary | ICD-10-CM | POA: Diagnosis not present

## 2021-06-24 DIAGNOSIS — M545 Low back pain, unspecified: Secondary | ICD-10-CM | POA: Diagnosis not present

## 2021-07-10 DIAGNOSIS — M79651 Pain in right thigh: Secondary | ICD-10-CM | POA: Diagnosis not present

## 2021-07-10 DIAGNOSIS — M48061 Spinal stenosis, lumbar region without neurogenic claudication: Secondary | ICD-10-CM | POA: Diagnosis not present

## 2021-07-10 DIAGNOSIS — M6281 Muscle weakness (generalized): Secondary | ICD-10-CM | POA: Diagnosis not present

## 2021-07-10 DIAGNOSIS — R293 Abnormal posture: Secondary | ICD-10-CM | POA: Diagnosis not present

## 2021-07-10 DIAGNOSIS — M545 Low back pain, unspecified: Secondary | ICD-10-CM | POA: Diagnosis not present

## 2021-07-10 DIAGNOSIS — R2689 Other abnormalities of gait and mobility: Secondary | ICD-10-CM | POA: Diagnosis not present

## 2021-07-17 DIAGNOSIS — M6281 Muscle weakness (generalized): Secondary | ICD-10-CM | POA: Diagnosis not present

## 2021-07-17 DIAGNOSIS — M545 Low back pain, unspecified: Secondary | ICD-10-CM | POA: Diagnosis not present

## 2021-07-17 DIAGNOSIS — R293 Abnormal posture: Secondary | ICD-10-CM | POA: Diagnosis not present

## 2021-07-17 DIAGNOSIS — M48061 Spinal stenosis, lumbar region without neurogenic claudication: Secondary | ICD-10-CM | POA: Diagnosis not present

## 2021-07-17 DIAGNOSIS — R2689 Other abnormalities of gait and mobility: Secondary | ICD-10-CM | POA: Diagnosis not present

## 2021-07-17 DIAGNOSIS — M79651 Pain in right thigh: Secondary | ICD-10-CM | POA: Diagnosis not present

## 2021-07-24 DIAGNOSIS — M545 Low back pain, unspecified: Secondary | ICD-10-CM | POA: Diagnosis not present

## 2021-07-24 DIAGNOSIS — M48061 Spinal stenosis, lumbar region without neurogenic claudication: Secondary | ICD-10-CM | POA: Diagnosis not present

## 2021-07-24 DIAGNOSIS — R293 Abnormal posture: Secondary | ICD-10-CM | POA: Diagnosis not present

## 2021-07-24 DIAGNOSIS — M79651 Pain in right thigh: Secondary | ICD-10-CM | POA: Diagnosis not present

## 2021-07-24 DIAGNOSIS — R2689 Other abnormalities of gait and mobility: Secondary | ICD-10-CM | POA: Diagnosis not present

## 2021-07-24 DIAGNOSIS — M6281 Muscle weakness (generalized): Secondary | ICD-10-CM | POA: Diagnosis not present

## 2021-07-31 DIAGNOSIS — M79651 Pain in right thigh: Secondary | ICD-10-CM | POA: Diagnosis not present

## 2021-07-31 DIAGNOSIS — R2689 Other abnormalities of gait and mobility: Secondary | ICD-10-CM | POA: Diagnosis not present

## 2021-07-31 DIAGNOSIS — I2510 Atherosclerotic heart disease of native coronary artery without angina pectoris: Secondary | ICD-10-CM | POA: Diagnosis not present

## 2021-07-31 DIAGNOSIS — E785 Hyperlipidemia, unspecified: Secondary | ICD-10-CM | POA: Diagnosis not present

## 2021-07-31 DIAGNOSIS — E559 Vitamin D deficiency, unspecified: Secondary | ICD-10-CM | POA: Diagnosis not present

## 2021-07-31 DIAGNOSIS — M48061 Spinal stenosis, lumbar region without neurogenic claudication: Secondary | ICD-10-CM | POA: Diagnosis not present

## 2021-07-31 DIAGNOSIS — M6281 Muscle weakness (generalized): Secondary | ICD-10-CM | POA: Diagnosis not present

## 2021-07-31 DIAGNOSIS — R293 Abnormal posture: Secondary | ICD-10-CM | POA: Diagnosis not present

## 2021-07-31 DIAGNOSIS — M545 Low back pain, unspecified: Secondary | ICD-10-CM | POA: Diagnosis not present

## 2021-07-31 DIAGNOSIS — Z125 Encounter for screening for malignant neoplasm of prostate: Secondary | ICD-10-CM | POA: Diagnosis not present

## 2021-08-05 DIAGNOSIS — M25551 Pain in right hip: Secondary | ICD-10-CM | POA: Diagnosis not present

## 2021-08-07 DIAGNOSIS — Z1331 Encounter for screening for depression: Secondary | ICD-10-CM | POA: Diagnosis not present

## 2021-08-07 DIAGNOSIS — N529 Male erectile dysfunction, unspecified: Secondary | ICD-10-CM | POA: Diagnosis not present

## 2021-08-07 DIAGNOSIS — M5136 Other intervertebral disc degeneration, lumbar region: Secondary | ICD-10-CM | POA: Diagnosis not present

## 2021-08-07 DIAGNOSIS — L989 Disorder of the skin and subcutaneous tissue, unspecified: Secondary | ICD-10-CM | POA: Diagnosis not present

## 2021-08-07 DIAGNOSIS — R82998 Other abnormal findings in urine: Secondary | ICD-10-CM | POA: Diagnosis not present

## 2021-08-07 DIAGNOSIS — M8580 Other specified disorders of bone density and structure, unspecified site: Secondary | ICD-10-CM | POA: Diagnosis not present

## 2021-08-07 DIAGNOSIS — N401 Enlarged prostate with lower urinary tract symptoms: Secondary | ICD-10-CM | POA: Diagnosis not present

## 2021-08-07 DIAGNOSIS — E785 Hyperlipidemia, unspecified: Secondary | ICD-10-CM | POA: Diagnosis not present

## 2021-08-07 DIAGNOSIS — Z0000 Encounter for general adult medical examination without abnormal findings: Secondary | ICD-10-CM | POA: Diagnosis not present

## 2021-08-07 DIAGNOSIS — Z8262 Family history of osteoporosis: Secondary | ICD-10-CM | POA: Diagnosis not present

## 2021-08-07 DIAGNOSIS — E559 Vitamin D deficiency, unspecified: Secondary | ICD-10-CM | POA: Diagnosis not present

## 2021-08-07 DIAGNOSIS — Z23 Encounter for immunization: Secondary | ICD-10-CM | POA: Diagnosis not present

## 2021-08-09 ENCOUNTER — Other Ambulatory Visit: Payer: Self-pay | Admitting: Internal Medicine

## 2021-08-09 DIAGNOSIS — E785 Hyperlipidemia, unspecified: Secondary | ICD-10-CM

## 2021-08-10 DIAGNOSIS — M545 Low back pain, unspecified: Secondary | ICD-10-CM | POA: Diagnosis not present

## 2021-08-15 DIAGNOSIS — M5416 Radiculopathy, lumbar region: Secondary | ICD-10-CM | POA: Diagnosis not present

## 2021-08-15 DIAGNOSIS — M5126 Other intervertebral disc displacement, lumbar region: Secondary | ICD-10-CM | POA: Diagnosis not present

## 2021-08-30 DIAGNOSIS — M5416 Radiculopathy, lumbar region: Secondary | ICD-10-CM | POA: Diagnosis not present

## 2021-09-03 ENCOUNTER — Ambulatory Visit
Admission: RE | Admit: 2021-09-03 | Discharge: 2021-09-03 | Disposition: A | Discharge status: Home / Self Care | Payer: No Typology Code available for payment source | Source: Ambulatory Visit | Attending: Internal Medicine | Admitting: Internal Medicine

## 2021-09-03 DIAGNOSIS — E785 Hyperlipidemia, unspecified: Secondary | ICD-10-CM

## 2021-09-07 ENCOUNTER — Emergency Department (HOSPITAL_BASED_OUTPATIENT_CLINIC_OR_DEPARTMENT_OTHER): Payer: Medicare PPO

## 2021-09-07 ENCOUNTER — Emergency Department (HOSPITAL_BASED_OUTPATIENT_CLINIC_OR_DEPARTMENT_OTHER)
Admission: EM | Admit: 2021-09-07 | Discharge: 2021-09-07 | Disposition: A | Discharge status: Home / Self Care | Payer: Medicare PPO | Attending: Emergency Medicine | Admitting: Emergency Medicine

## 2021-09-07 ENCOUNTER — Other Ambulatory Visit: Payer: Self-pay

## 2021-09-07 ENCOUNTER — Encounter (HOSPITAL_BASED_OUTPATIENT_CLINIC_OR_DEPARTMENT_OTHER): Payer: Self-pay | Admitting: Emergency Medicine

## 2021-09-07 DIAGNOSIS — S0003XA Contusion of scalp, initial encounter: Secondary | ICD-10-CM | POA: Insufficient documentation

## 2021-09-07 DIAGNOSIS — S0990XA Unspecified injury of head, initial encounter: Secondary | ICD-10-CM | POA: Diagnosis not present

## 2021-09-07 DIAGNOSIS — M47812 Spondylosis without myelopathy or radiculopathy, cervical region: Secondary | ICD-10-CM

## 2021-09-07 DIAGNOSIS — S61511A Laceration without foreign body of right wrist, initial encounter: Secondary | ICD-10-CM | POA: Diagnosis not present

## 2021-09-07 DIAGNOSIS — S80212A Abrasion, left knee, initial encounter: Secondary | ICD-10-CM | POA: Insufficient documentation

## 2021-09-07 DIAGNOSIS — S60512A Abrasion of left hand, initial encounter: Secondary | ICD-10-CM | POA: Insufficient documentation

## 2021-09-07 DIAGNOSIS — S0011XA Contusion of right eyelid and periocular area, initial encounter: Secondary | ICD-10-CM | POA: Diagnosis not present

## 2021-09-07 DIAGNOSIS — Y9302 Activity, running: Secondary | ICD-10-CM | POA: Insufficient documentation

## 2021-09-07 DIAGNOSIS — N182 Chronic kidney disease, stage 2 (mild): Secondary | ICD-10-CM | POA: Diagnosis not present

## 2021-09-07 DIAGNOSIS — W19XXXA Unspecified fall, initial encounter: Secondary | ICD-10-CM

## 2021-09-07 DIAGNOSIS — S0083XA Contusion of other part of head, initial encounter: Secondary | ICD-10-CM | POA: Diagnosis not present

## 2021-09-07 DIAGNOSIS — T148XXA Other injury of unspecified body region, initial encounter: Secondary | ICD-10-CM

## 2021-09-07 DIAGNOSIS — M47892 Other spondylosis, cervical region: Secondary | ICD-10-CM | POA: Insufficient documentation

## 2021-09-07 DIAGNOSIS — T07XXXA Unspecified multiple injuries, initial encounter: Secondary | ICD-10-CM

## 2021-09-07 DIAGNOSIS — S80211A Abrasion, right knee, initial encounter: Secondary | ICD-10-CM | POA: Diagnosis not present

## 2021-09-07 DIAGNOSIS — Z043 Encounter for examination and observation following other accident: Secondary | ICD-10-CM | POA: Diagnosis not present

## 2021-09-07 DIAGNOSIS — S199XXA Unspecified injury of neck, initial encounter: Secondary | ICD-10-CM | POA: Diagnosis not present

## 2021-09-07 DIAGNOSIS — I2510 Atherosclerotic heart disease of native coronary artery without angina pectoris: Secondary | ICD-10-CM | POA: Diagnosis not present

## 2021-09-07 DIAGNOSIS — W01198A Fall on same level from slipping, tripping and stumbling with subsequent striking against other object, initial encounter: Secondary | ICD-10-CM | POA: Insufficient documentation

## 2021-09-07 MED ORDER — ACETAMINOPHEN 500 MG PO TABS
1000.0000 mg | ORAL_TABLET | Freq: Once | ORAL | Status: AC
  Administered 2021-09-07: 1000 mg via ORAL
  Filled 2021-09-07: qty 2

## 2021-09-07 NOTE — ED Notes (Signed)
Pt denies neck pain, denies hip pain, denies shob or CP ?

## 2021-09-07 NOTE — ED Notes (Signed)
Pt discharged to home. Discharge instructions have been discussed with patient and/or family members. Pt verbally acknowledges understanding d/c instructions, and endorses comprehension to checkout at registration before leaving. Pt ambulatory with steady gait  ?

## 2021-09-07 NOTE — ED Triage Notes (Signed)
Pt arrives pov, ambulatory to triage with c/o mechanical fall this am while running. Swelling  noted to right side forehead, abrasions to right knee, left and right hand and right wrist. Pt denies loc, denies neck pain. Also reports lower lip pain ?

## 2021-09-07 NOTE — ED Notes (Signed)
Pt returned from radiology.

## 2021-09-07 NOTE — ED Provider Notes (Signed)
[facility]PART[facility] Provider Note   CSN: [id number] Arrival date & time: 09/07/21  0815     History  Chief Complaint  Patient presents with   Lytle Michaels    Chad Gallegos is a 88 y.o. male.  HPI     88yo male with history of CKD stage 2, CAD, BPH, hyperlipidemia, degenerative disc disease, presents with concern for fall while running.  Was running for exercise today when he tripped and fell, hit his knee, head, hands.  Thinks TDap within 5 years, maybe 5 years ago.  No LOC. Reports 2-3/10 pain to hematoma above right eye.  Has knee abrasion but was able to walk home half a mild and denies any significant knee pain, also denies hip pain.  Denies neck pain, back pain, chest pain, dyspnea, abdominal pain, pelvic pain, numbness, weakness, visual changes, difficulty talking or walking.  During the exam he does report some sensation of numbness to his right pinky finger, notes chronic contracture there that is unchanged.  Feels some soreness to the left hand when he extends it and some sensation of numbness just to the tip of his ring finger and pain.   He is not on anticoagulation. No epistaxis nose pain, lower jaw pain.   Past Medical History:  Diagnosis Date   Prostate hypertrophy     Home Medications Prior to Admission medications   Medication Sig Start Date End Date Taking? Authorizing Provider  Ascorbic Acid (VITAMIN C) 1000 MG tablet Take 1,000 mg by mouth daily.    [provider]  Cholecalciferol (VITAMIN D3) 2000 units TABS Take by mouth.    [provider]  diclofenac sodium (VOLTAREN) 1 % GEL Apply topically. To affected area 3-4 times daily.  Disp qs 1 month.     [provider]  dutasteride (AVODART) 0.5 MG capsule Take 0.5 mg by mouth daily. 05/21/16   [provider]  nitroGLYCERIN (NITRODUR - DOSED IN MG/24 HR) 0.2 mg/hr patch Place 1/4 patch to the affected area daily. 02/25/18   Coralyn Helling, DO  OVER THE  COUNTER MEDICATION Over the counter vitamins:b,d    [provider]  vitamin B-12 (CYANOCOBALAMIN) 500 MCG tablet Take 500 mcg by mouth daily.    [provider]      Allergies    Patient has no known allergies.    Review of Systems   Review of Systems  Physical Exam Updated Vital Signs BP (!) 154/80    Pulse (!) 57    Temp (!) 97.5 F (36.4 C) (Tympanic)    Resp 18    Ht 5\' 7"  (1.702 m)    Wt 65.8 kg    SpO2 99%    BMI 22.71 kg/m  Physical Exam Vitals and nursing note reviewed.  Constitutional:      General: He is not in acute distress.    Appearance: He is well-developed. He is not diaphoretic.  HENT:     Head: Normocephalic and atraumatic.  Eyes:     Conjunctiva/sclera: Conjunctivae normal.  Cardiovascular:     Rate and Rhythm: Normal rate and regular rhythm.     Heart sounds: Normal heart sounds. No murmur heard.   No friction rub. No gallop.  Pulmonary:     Effort: Pulmonary effort is normal. No respiratory distress.     Breath sounds: Normal breath sounds. No wheezing or rales.  Chest:     Chest wall: No tenderness.  Abdominal:     General: There  is no distension.     Palpations: Abdomen is soft.     Tenderness: There is no abdominal tenderness. There is no guarding.  Musculoskeletal:        General: Tenderness (MCP right hand, right wrist ulnar side, mild soreness left MCP worse with movement.) present.     Cervical back: Normal range of motion.     Comments: Multiple abrasions over MCP, PIP right hand Right wrist with 2cm skin tear Left palmar base of hand with 1cm abrasion, tiny abrasion tip of finger Chronic contracture right pinky finger, reprots some tingling to tip   Skin:    General: Skin is warm and dry.  Neurological:     Mental Status: He is alert and oriented to person, place, and time.    ED Results / Procedures / Treatments   Labs (all labs ordered are listed, but only abnormal results are displayed) Labs Reviewed - No data to  display  EKG None  Radiology DG Wrist Complete Right  Result Date: 09/07/2021 CLINICAL DATA:  Status post fall while running. EXAM: RIGHT WRIST - COMPLETE 3+ VIEW; RIGHT HAND - COMPLETE 3+ VIEW COMPARISON:  None. FINDINGS: No acute fracture or dislocation. No aggressive osseous lesion. Normal alignment. Old healed fracture deformity of the fifth metacarpal. Mild osteoarthritis of the second and third D IP joints. Soft tissue are unremarkable. No radiopaque foreign body or soft tissue emphysema. IMPRESSION: 1. No acute osseous injury of the right wrist and hand. Electronically Signed   By: Kathreen Devoid M.D.   On: 09/07/2021 09:06   CT Head Wo Contrast  Result Date: 09/07/2021 CLINICAL DATA:  Head and neck trauma.  Status post fall. EXAM: CT HEAD WITHOUT CONTRAST CT CERVICAL SPINE WITHOUT CONTRAST TECHNIQUE: Multidetector CT imaging of the head and cervical spine was performed following the standard protocol without intravenous contrast. Multiplanar CT image reconstructions of the cervical spine were also generated. RADIATION DOSE REDUCTION: This exam was performed according to the departmental dose-optimization program which includes automated exposure control, adjustment of the mA and/or kV according to patient size and/or use of iterative reconstruction technique. COMPARISON:  None. FINDINGS: Brain: No evidence of acute infarction, hemorrhage, extra-axial collection, ventriculomegaly, or mass effect. Generalized cerebral atrophy. Periventricular white matter low attenuation likely secondary to microangiopathy. Vascular: Cerebrovascular atherosclerotic calcifications are noted. No hyperdense vessels. Skull: Negative for fracture or focal lesion. Sinuses/Orbits: Visualized portions of the orbits are unremarkable. Visualized portions of the paranasal sinuses are unremarkable. Visualized portions of the mastoid air cells are unremarkable. Other: Small left parietal sebaceous cyst. Large right frontal  scalp/supraorbital hematoma. CT CERVICAL SPINE FINDINGS Alignment: 2 mm anterolisthesis of C2 on C3 secondary to facet disease. Skull base and vertebrae: No acute fracture. No primary bone lesion or focal pathologic process. Soft tissues and spinal canal: No prevertebral fluid or swelling. No visible canal hematoma. Disc levels: Degenerative disease with disc height loss at C5-6, C6-7 and C7-T1. Moderate left facet arthropathy at C2-3. At C3-4 there is mild right facet arthropathy, moderate left facet arthropathy, and moderate left foraminal stenosis. At C4-5 there is mild bilateral facet arthropathy with mild right foraminal stenosis. At C5-6 there is mild bilateral facet arthropathy, bilateral uncovertebral degenerative changes, bilateral foraminal stenosis. At C6-7 there is bilateral uncovertebral degenerative changes with mild bilateral foraminal stenosis. At C7-T1 there is mild bilateral facet arthropathy. Upper chest: Lung apices are clear. Other: No fluid collection or hematoma. IMPRESSION: 1. No acute intracranial pathology. 2. Large right frontal scalp/supraorbital hematoma. 3.  No acute osseous injury of the cervical spine. 4. Cervical spine spondylosis as described above. Electronically Signed   By: Kathreen Devoid M.D.   On: 09/07/2021 09:13   CT Cervical Spine Wo Contrast  Result Date: 09/07/2021 CLINICAL DATA:  Head and neck trauma.  Status post fall. EXAM: CT HEAD WITHOUT CONTRAST CT CERVICAL SPINE WITHOUT CONTRAST TECHNIQUE: Multidetector CT imaging of the head and cervical spine was performed following the standard protocol without intravenous contrast. Multiplanar CT image reconstructions of the cervical spine were also generated. RADIATION DOSE REDUCTION: This exam was performed according to the departmental dose-optimization program which includes automated exposure control, adjustment of the mA and/or kV according to patient size and/or use of iterative reconstruction technique. COMPARISON:   None. FINDINGS: Brain: No evidence of acute infarction, hemorrhage, extra-axial collection, ventriculomegaly, or mass effect. Generalized cerebral atrophy. Periventricular white matter low attenuation likely secondary to microangiopathy. Vascular: Cerebrovascular atherosclerotic calcifications are noted. No hyperdense vessels. Skull: Negative for fracture or focal lesion. Sinuses/Orbits: Visualized portions of the orbits are unremarkable. Visualized portions of the paranasal sinuses are unremarkable. Visualized portions of the mastoid air cells are unremarkable. Other: Small left parietal sebaceous cyst. Large right frontal scalp/supraorbital hematoma. CT CERVICAL SPINE FINDINGS Alignment: 2 mm anterolisthesis of C2 on C3 secondary to facet disease. Skull base and vertebrae: No acute fracture. No primary bone lesion or focal pathologic process. Soft tissues and spinal canal: No prevertebral fluid or swelling. No visible canal hematoma. Disc levels: Degenerative disease with disc height loss at C5-6, C6-7 and C7-T1. Moderate left facet arthropathy at C2-3. At C3-4 there is mild right facet arthropathy, moderate left facet arthropathy, and moderate left foraminal stenosis. At C4-5 there is mild bilateral facet arthropathy with mild right foraminal stenosis. At C5-6 there is mild bilateral facet arthropathy, bilateral uncovertebral degenerative changes, bilateral foraminal stenosis. At C6-7 there is bilateral uncovertebral degenerative changes with mild bilateral foraminal stenosis. At C7-T1 there is mild bilateral facet arthropathy. Upper chest: Lung apices are clear. Other: No fluid collection or hematoma. IMPRESSION: 1. No acute intracranial pathology. 2. Large right frontal scalp/supraorbital hematoma. 3.  No acute osseous injury of the cervical spine. 4. Cervical spine spondylosis as described above. Electronically Signed   By: Kathreen Devoid M.D.   On: 09/07/2021 09:13   DG Hand Complete Left  Result Date:  09/07/2021 CLINICAL DATA:  Status post fall. EXAM: LEFT HAND - COMPLETE 3+ VIEW COMPARISON:  None. FINDINGS: There is no evidence of fracture or dislocation. There is no evidence of arthropathy or other focal bone abnormality. Soft tissues are unremarkable. IMPRESSION: Negative. Electronically Signed   By: Kerby Moors M.D.   On: 09/07/2021 09:06   DG Hand Complete Right  Result Date: 09/07/2021 CLINICAL DATA:  Status post fall while running. EXAM: RIGHT WRIST - COMPLETE 3+ VIEW; RIGHT HAND - COMPLETE 3+ VIEW COMPARISON:  None. FINDINGS: No acute fracture or dislocation. No aggressive osseous lesion. Normal alignment. Old healed fracture deformity of the fifth metacarpal. Mild osteoarthritis of the second and third D IP joints. Soft tissue are unremarkable. No radiopaque foreign body or soft tissue emphysema. IMPRESSION: 1. No acute osseous injury of the right wrist and hand. Electronically Signed   By: Kathreen Devoid M.D.   On: 09/07/2021 09:06    Procedures Procedures    Medications Ordered in ED Medications - No data to display  ED Course/ Medical Decision Making/ A&P  Medical Decision Making Amount and/or Complexity of Data Reviewed Radiology: ordered.    88yo male with history of CKD stage 2, CAD, BPH, hyperlipidemia, degenerative disc disease, presents with concern for fall while running.  Given head trauma, tingling to finger, ordered head and CSpine CT.  CT personally evaluated by me and show no acute abnormalities. CSpine read by radiology with cervical spondylosis, no acute traumatic findings.  XR of the right wrist and bilateral hands reviewed by me and shows no acute abnormalities.  Suspect tingling to finger related to local injury/swelling.  Multiple abrasions, UTD on TDap.    Have low suspicion for other injuries by history and exam.   Recommend ice, continued supportive care/wound care and PCP follow up.         Final Clinical  Impression(s) / ED Diagnoses Final diagnoses:  Fall, initial encounter  Multiple abrasions  Hematoma  Cervical spondylosis    Rx / DC Orders ED Discharge Orders     None         Gareth Morgan, MD 09/07/21 [phone]

## 2021-09-07 NOTE — ED Notes (Signed)
Patient transported to X-ray and CT 

## 2021-09-19 DIAGNOSIS — M5416 Radiculopathy, lumbar region: Secondary | ICD-10-CM | POA: Diagnosis not present

## 2021-09-19 DIAGNOSIS — M5126 Other intervertebral disc displacement, lumbar region: Secondary | ICD-10-CM | POA: Diagnosis not present

## 2021-09-27 DIAGNOSIS — M5416 Radiculopathy, lumbar region: Secondary | ICD-10-CM | POA: Diagnosis not present

## 2021-10-15 DIAGNOSIS — M5416 Radiculopathy, lumbar region: Secondary | ICD-10-CM | POA: Diagnosis not present

## 2021-10-15 DIAGNOSIS — M5126 Other intervertebral disc displacement, lumbar region: Secondary | ICD-10-CM | POA: Diagnosis not present

## 2021-11-08 DIAGNOSIS — M5416 Radiculopathy, lumbar region: Secondary | ICD-10-CM | POA: Diagnosis not present

## 2021-11-27 ENCOUNTER — Other Ambulatory Visit: Payer: Self-pay | Admitting: Neurosurgery

## 2021-12-11 ENCOUNTER — Other Ambulatory Visit: Payer: Self-pay | Admitting: Neurosurgery

## 2021-12-12 ENCOUNTER — Other Ambulatory Visit: Payer: Self-pay | Admitting: Neurosurgery

## 2021-12-23 NOTE — Pre-Procedure Instructions (Signed)
Surgical Instructions    Your procedure is scheduled on January 09, 2022.  Report to [facility] Main Entrance "A" at 5:30 A.M., then check in with the Admitting office.  Call this number if you have problems the morning of surgery:  [phone]   If you have any questions prior to your surgery date call [phone]: Open Monday-Friday 8am-4pm    Remember:  Do not eat or drink after midnight the night before your surgery     Take these medicines the morning of surgery with A SIP OF WATER:  rosuvastatin (CRESTOR)  mirabegron ER (MYRBETRIQ)   dutasteride (AVODART)    Take these medicines the morning of surgery AS NEEDED:  acetaminophen (TYLENOL)  As of today, STOP taking any Aspirin (unless otherwise instructed by your surgeon) Aleve, Naproxen, Ibuprofen, Motrin, Advil, Goody's, BC's, all herbal medications, fish oil, and all vitamins.                     Do NOT Smoke (Tobacco/Vaping) for 24 hours prior to your procedure.  If you use a CPAP at night, you may bring your mask/headgear for your overnight stay.   Contacts, glasses, piercing's, hearing aid's, dentures or partials may not be worn into surgery, please bring cases for these belongings.    For patients admitted to the hospital, discharge time will be determined by your treatment team.   Patients discharged the day of surgery will not be allowed to drive home, and someone needs to stay with them for 24 hours.  SURGICAL WAITING ROOM VISITATION Patients having surgery or a procedure may have two support people in the waiting room. These visitors may be switched out with other visitors if needed. Children under the age of 72 must have an adult accompany them who is not the patient. If the patient needs to stay at the hospital during part of their recovery, the visitor guidelines for inpatient rooms apply.  Please refer to the [facility] website for the visitor guidelines for Inpatients (after your surgery is over and you are  in a regular room).    Special instructions:   [place]- Preparing For Surgery  Before surgery, you can play an important role. Because skin is not sterile, your skin needs to be as free of germs as possible. You can reduce the number of germs on your skin by washing with CHG (chlorahexidine gluconate) Soap before surgery.  CHG is an antiseptic cleaner which kills germs and bonds with the skin to continue killing germs even after washing.    Oral Hygiene is also important to reduce your risk of infection.  Remember - BRUSH YOUR TEETH THE MORNING OF SURGERY WITH YOUR REGULAR TOOTHPASTE  Please do not use if you have an allergy to CHG or antibacterial soaps. If your skin becomes reddened/irritated stop using the CHG.  Do not shave (including legs and underarms) for at least 48 hours prior to first CHG shower. It is OK to shave your face.  Please follow these instructions carefully.   Shower the NIGHT BEFORE SURGERY and the MORNING OF SURGERY  If you chose to wash your hair, wash your hair first as usual with your normal shampoo.  After you shampoo, rinse your hair and body thoroughly to remove the shampoo.  Use CHG Soap as you would any other liquid soap. You can apply CHG directly to the skin and wash gently with a scrungie or a clean washcloth.   Apply the CHG Soap to your body  ONLY FROM THE NECK DOWN.  Do not use on open wounds or open sores. Avoid contact with your eyes, ears, mouth and genitals (private parts). Wash Face and genitals (private parts)  with your normal soap.   Wash thoroughly, paying special attention to the area where your surgery will be performed.  Thoroughly rinse your body with warm water from the neck down.  DO NOT shower/wash with your normal soap after using and rinsing off the CHG Soap.  Pat yourself dry with a CLEAN TOWEL.  Wear CLEAN PAJAMAS to bed the night before surgery  Place CLEAN SHEETS on your bed the night before your surgery  DO NOT SLEEP  WITH PETS.   Day of Surgery: Take a shower with CHG soap.  Do not wear jewelry or makeup Do not wear lotions, powders, perfumes/colognes, or deodorant. Do not shave 48 hours prior to surgery.  Men may shave face and neck. Do not bring valuables to the hospital.  [facility] is not responsible for any belongings or valuables.  Wear Clean/Comfortable clothing the morning of surgery  Remember to brush your teeth WITH YOUR REGULAR TOOTHPASTE.   Please read over the following fact sheets that you were given.    If you received a COVID test during your pre-op visit  it is requested that you wear a mask when out in public, stay away from anyone that may not be feeling well and notify your surgeon if you develop symptoms. If you have been in contact with anyone that has tested positive in the last 10 days please notify you surgeon.

## 2021-12-24 ENCOUNTER — Encounter (HOSPITAL_COMMUNITY)
Admission: RE | Admit: 2021-12-24 | Discharge: 2021-12-24 | Disposition: A | Discharge status: Home / Self Care | Payer: Medicare PPO | Source: Ambulatory Visit | Attending: Neurosurgery | Admitting: Neurosurgery

## 2021-12-24 ENCOUNTER — Encounter (HOSPITAL_COMMUNITY): Payer: Self-pay

## 2021-12-24 ENCOUNTER — Other Ambulatory Visit: Payer: Self-pay

## 2021-12-24 VITALS — BP 133/76 | HR 56 | Temp 98.0°F | Resp 17 | Ht 66.0 in | Wt 149.1 lb

## 2021-12-24 DIAGNOSIS — Z01818 Encounter for other preprocedural examination: Secondary | ICD-10-CM

## 2021-12-24 DIAGNOSIS — Z01812 Encounter for preprocedural laboratory examination: Secondary | ICD-10-CM | POA: Insufficient documentation

## 2021-12-24 HISTORY — DX: Unspecified osteoarthritis, unspecified site: M19.90

## 2021-12-24 LAB — CBC
HCT: 42.6 % (ref 39.0–52.0)
Hemoglobin: 14.4 g/dL (ref 13.0–17.0)
MCH: 32.4 pg (ref 26.0–34.0)
MCHC: 33.8 g/dL (ref 30.0–36.0)
MCV: 95.9 fL (ref 80.0–100.0)
Platelets: 172 10*3/uL (ref 150–400)
RBC: 4.44 MIL/uL (ref 4.22–5.81)
RDW: 12.6 % (ref 11.5–15.5)
WBC: 6.7 10*3/uL (ref 4.0–10.5)
nRBC: 0 % (ref 0.0–0.2)

## 2021-12-24 LAB — SURGICAL PCR SCREEN
MRSA, PCR: NEGATIVE
Staphylococcus aureus: NEGATIVE

## 2021-12-24 NOTE — Progress Notes (Signed)
PCP - Dr. Rodrigo Ran Cardiologist - Denies  PPM/ICD - Denies Device Orders - n/a Rep Notified - n/a  Chest x-ray - denies EKG - 04/04/2016 Stress Test - denies ECHO - denies Cardiac Cath - denies Pt has CT Cardiac Scoring 08/31/2021  Sleep Study - denies CPAP - n/a  No DM  Blood Thinner Instructions: n/a Aspirin Instructions: Stop aspirin today   NPO after midnight  COVID TEST- n/a   Anesthesia review: No.  Patient denies shortness of breath, fever, cough and chest pain at PAT appointment   All instructions explained to the patient, with a verbal understanding of the material. Patient agrees to go over the instructions while at home for a better understanding. Patient also instructed to self quarantine after being tested for COVID-19. The opportunity to ask questions was provided.

## 2022-01-09 ENCOUNTER — Ambulatory Visit (HOSPITAL_COMMUNITY): Payer: Medicare PPO | Admitting: Anesthesiology

## 2022-01-09 ENCOUNTER — Ambulatory Visit (HOSPITAL_COMMUNITY)
Admission: RE | Admit: 2022-01-09 | Discharge: 2022-01-09 | Disposition: A | Discharge status: Home / Self Care | Payer: Medicare PPO | Source: Ambulatory Visit | Attending: Neurosurgery | Admitting: Neurosurgery

## 2022-01-09 ENCOUNTER — Ambulatory Visit (HOSPITAL_BASED_OUTPATIENT_CLINIC_OR_DEPARTMENT_OTHER): Payer: Medicare PPO | Admitting: Anesthesiology

## 2022-01-09 ENCOUNTER — Ambulatory Visit (HOSPITAL_COMMUNITY): Payer: Medicare PPO

## 2022-01-09 ENCOUNTER — Encounter (HOSPITAL_COMMUNITY): Payer: Self-pay

## 2022-01-09 ENCOUNTER — Other Ambulatory Visit: Payer: Self-pay

## 2022-01-09 ENCOUNTER — Encounter (HOSPITAL_COMMUNITY)
Admission: RE | Disposition: A | Discharge status: Home / Self Care | Payer: Self-pay | Source: Ambulatory Visit | Attending: Neurosurgery

## 2022-01-09 DIAGNOSIS — Z79899 Other long term (current) drug therapy: Secondary | ICD-10-CM | POA: Diagnosis not present

## 2022-01-09 DIAGNOSIS — Z0389 Encounter for observation for other suspected diseases and conditions ruled out: Secondary | ICD-10-CM | POA: Diagnosis not present

## 2022-01-09 DIAGNOSIS — M5116 Intervertebral disc disorders with radiculopathy, lumbar region: Secondary | ICD-10-CM | POA: Insufficient documentation

## 2022-01-09 DIAGNOSIS — M1990 Unspecified osteoarthritis, unspecified site: Secondary | ICD-10-CM | POA: Diagnosis not present

## 2022-01-09 HISTORY — PX: LUMBAR LAMINECTOMY/ DECOMPRESSION WITH MET-RX: SHX5959

## 2022-01-09 SURGERY — LUMBAR LAMINECTOMY/ DECOMPRESSION WITH MET-RX
Anesthesia: General | Laterality: Right

## 2022-01-09 MED ORDER — LIDOCAINE-EPINEPHRINE 1 %-1:100000 IJ SOLN
INTRAMUSCULAR | Status: DC | PRN
  Administered 2022-01-09: 3 mL

## 2022-01-09 MED ORDER — DEXAMETHASONE SODIUM PHOSPHATE 10 MG/ML IJ SOLN
INTRAMUSCULAR | Status: DC | PRN
  Administered 2022-01-09: 10 mg via INTRAVENOUS

## 2022-01-09 MED ORDER — OXYCODONE-ACETAMINOPHEN 5-325 MG PO TABS: 1.0000 | ORAL_TABLET | Freq: Four times a day (QID) | ORAL | 0 refills | Status: AC | PRN

## 2022-01-09 MED ORDER — CHLORHEXIDINE GLUCONATE CLOTH 2 % EX PADS: 6.0000 | MEDICATED_PAD | Freq: Once | CUTANEOUS | Status: DC

## 2022-01-09 MED ORDER — THROMBIN 5000 UNITS EX SOLR
CUTANEOUS | Status: AC
  Filled 2022-01-09: qty 5000

## 2022-01-09 MED ORDER — THROMBIN 5000 UNITS EX SOLR
OROMUCOSAL | Status: DC | PRN
  Administered 2022-01-09: 5 mL via TOPICAL

## 2022-01-09 MED ORDER — ACETAMINOPHEN 500 MG PO TABS
1000.0000 mg | ORAL_TABLET | Freq: Once | ORAL | Status: AC
  Administered 2022-01-09: 1000 mg via ORAL
  Filled 2022-01-09: qty 2

## 2022-01-09 MED ORDER — AMISULPRIDE (ANTIEMETIC) 5 MG/2ML IV SOLN: 10.0000 mg | Freq: Once | INTRAVENOUS | Status: DC | PRN

## 2022-01-09 MED ORDER — PROPOFOL 10 MG/ML IV BOLUS
INTRAVENOUS | Status: AC
  Filled 2022-01-09: qty 20

## 2022-01-09 MED ORDER — ROCURONIUM BROMIDE 10 MG/ML (PF) SYRINGE
PREFILLED_SYRINGE | INTRAVENOUS | Status: DC | PRN
  Administered 2022-01-09: 20 mg via INTRAVENOUS
  Administered 2022-01-09: 50 mg via INTRAVENOUS
  Administered 2022-01-09: 30 mg via INTRAVENOUS

## 2022-01-09 MED ORDER — PROPOFOL 10 MG/ML IV BOLUS
INTRAVENOUS | Status: DC | PRN
  Administered 2022-01-09: 150 mg via INTRAVENOUS

## 2022-01-09 MED ORDER — METHYLPREDNISOLONE ACETATE 80 MG/ML IJ SUSP
INTRAMUSCULAR | Status: DC | PRN
  Administered 2022-01-09: 80 mg

## 2022-01-09 MED ORDER — BUPIVACAINE HCL (PF) 0.5 % IJ SOLN
INTRAMUSCULAR | Status: AC
  Filled 2022-01-09: qty 30

## 2022-01-09 MED ORDER — EPHEDRINE SULFATE-NACL 50-0.9 MG/10ML-% IV SOSY
PREFILLED_SYRINGE | INTRAVENOUS | Status: DC | PRN
  Administered 2022-01-09 (×3): 5 mg via INTRAVENOUS

## 2022-01-09 MED ORDER — CEFAZOLIN SODIUM-DEXTROSE 2-4 GM/100ML-% IV SOLN
2.0000 g | INTRAVENOUS | Status: AC
  Administered 2022-01-09: 2 g via INTRAVENOUS

## 2022-01-09 MED ORDER — FENTANYL CITRATE (PF) 250 MCG/5ML IJ SOLN
INTRAMUSCULAR | Status: AC
  Filled 2022-01-09: qty 5

## 2022-01-09 MED ORDER — FENTANYL CITRATE (PF) 250 MCG/5ML IJ SOLN
INTRAMUSCULAR | Status: DC | PRN
  Administered 2022-01-09: 100 ug via INTRAVENOUS

## 2022-01-09 MED ORDER — METHYLPREDNISOLONE ACETATE 80 MG/ML IJ SUSP
INTRAMUSCULAR | Status: AC
  Filled 2022-01-09: qty 1

## 2022-01-09 MED ORDER — CHLORHEXIDINE GLUCONATE 0.12 % MT SOLN
15.0000 mL | Freq: Once | OROMUCOSAL | Status: AC
  Administered 2022-01-09: 15 mL via OROMUCOSAL
  Filled 2022-01-09: qty 15

## 2022-01-09 MED ORDER — ORAL CARE MOUTH RINSE: 15.0000 mL | Freq: Once | OROMUCOSAL | Status: AC

## 2022-01-09 MED ORDER — FENTANYL CITRATE (PF) 100 MCG/2ML IJ SOLN: 25.0000 ug | INTRAMUSCULAR | Status: DC | PRN

## 2022-01-09 MED ORDER — CYCLOBENZAPRINE HCL 10 MG PO TABS: 10.0000 mg | ORAL_TABLET | Freq: Three times a day (TID) | ORAL | 2 refills | Status: AC | PRN

## 2022-01-09 MED ORDER — ONDANSETRON HCL 4 MG/2ML IJ SOLN
INTRAMUSCULAR | Status: DC | PRN
  Administered 2022-01-09: 4 mg via INTRAVENOUS

## 2022-01-09 MED ORDER — LIDOCAINE 2% (20 MG/ML) 5 ML SYRINGE
INTRAMUSCULAR | Status: DC | PRN
  Administered 2022-01-09: 60 mg via INTRAVENOUS

## 2022-01-09 MED ORDER — LACTATED RINGERS IV SOLN: INTRAVENOUS | Status: DC | PRN

## 2022-01-09 MED ORDER — SUGAMMADEX SODIUM 200 MG/2ML IV SOLN
INTRAVENOUS | Status: DC | PRN
  Administered 2022-01-09: 200 mg via INTRAVENOUS

## 2022-01-09 MED ORDER — LIDOCAINE-EPINEPHRINE 1 %-1:100000 IJ SOLN
INTRAMUSCULAR | Status: AC
  Filled 2022-01-09: qty 1

## 2022-01-09 MED ORDER — 0.9 % SODIUM CHLORIDE (POUR BTL) OPTIME
TOPICAL | Status: DC | PRN
  Administered 2022-01-09: 1000 mL

## 2022-01-09 MED ORDER — CEFAZOLIN SODIUM-DEXTROSE 2-4 GM/100ML-% IV SOLN
INTRAVENOUS | Status: AC
  Filled 2022-01-09: qty 100

## 2022-01-09 MED ORDER — LACTATED RINGERS IV SOLN: INTRAVENOUS | Status: DC

## 2022-01-09 SURGICAL SUPPLY — 65 items
ADH SKN CLS LQ APL DERMABOND (GAUZE/BANDAGES/DRESSINGS) ×1
APL SKNCLS STERI-STRIP NONHPOA (GAUZE/BANDAGES/DRESSINGS)
BAG COUNTER SPONGE SURGICOUNT (BAG) ×2 IMPLANT
BAG SPNG CNTER NS LX DISP (BAG) ×1
BAND INSRT 18 STRL LF DISP RB (MISCELLANEOUS) ×2
BAND RUBBER #18 3X1/16 STRL (MISCELLANEOUS) ×4 IMPLANT
BENZOIN TINCTURE PRP APPL 2/3 (GAUZE/BANDAGES/DRESSINGS) IMPLANT
BLADE CLIPPER SURG (BLADE) IMPLANT
BUR CARBIDE MATCH 3.0 (BURR) IMPLANT
BUR PRECISION MATCH 3.0 13 (BURR) ×2 IMPLANT
CANISTER SUCT 3000ML PPV (MISCELLANEOUS) ×2 IMPLANT
DERMABOND ADHESIVE PROPEN (GAUZE/BANDAGES/DRESSINGS) ×1
DERMABOND ADVANCED .7 DNX6 (GAUZE/BANDAGES/DRESSINGS) IMPLANT
DRAPE C-ARM 42X72 X-RAY (DRAPES) ×4 IMPLANT
DRAPE LAPAROTOMY 100X72X124 (DRAPES) ×2 IMPLANT
DRAPE MICROSCOPE LEICA (MISCELLANEOUS) ×2 IMPLANT
DRAPE SURG 17X23 STRL (DRAPES) ×2 IMPLANT
DRSG MEPILEX BORDER 4X4 (GAUZE/BANDAGES/DRESSINGS) ×2 IMPLANT
DRSG OPSITE POSTOP 3X4 (GAUZE/BANDAGES/DRESSINGS) ×1 IMPLANT
DURAPREP 26ML APPLICATOR (WOUND CARE) ×2 IMPLANT
ELECT BLADE INSULATED 4IN (ELECTROSURGICAL) ×2
ELECT REM PT RETURN 9FT ADLT (ELECTROSURGICAL) ×2
ELECTRODE BLADE INSULATED 4IN (ELECTROSURGICAL) ×1 IMPLANT
ELECTRODE REM PT RTRN 9FT ADLT (ELECTROSURGICAL) ×1 IMPLANT
GAUZE 4X4 16PLY ~~LOC~~+RFID DBL (SPONGE) IMPLANT
GAUZE SPONGE 4X4 12PLY STRL (GAUZE/BANDAGES/DRESSINGS) IMPLANT
GLOVE BIOGEL M 8.0 STRL (GLOVE) ×1 IMPLANT
GLOVE BIOGEL PI IND STRL 7.5 (GLOVE) ×2 IMPLANT
GLOVE BIOGEL PI IND STRL 8.5 (GLOVE) IMPLANT
GLOVE BIOGEL PI INDICATOR 7.5 (GLOVE) ×2
GLOVE BIOGEL PI INDICATOR 8.5 (GLOVE) ×1
GLOVE ECLIPSE 7.5 STRL STRAW (GLOVE) ×2 IMPLANT
GLOVE SURG ENC MOIS LTX SZ8 (GLOVE) ×2 IMPLANT
GLOVE SURG UNDER POLY LF SZ8.5 (GLOVE) ×2 IMPLANT
GOWN STRL REUS W/ TWL LRG LVL3 (GOWN DISPOSABLE) ×2 IMPLANT
GOWN STRL REUS W/ TWL XL LVL3 (GOWN DISPOSABLE) ×1 IMPLANT
GOWN STRL REUS W/TWL 2XL LVL3 (GOWN DISPOSABLE) IMPLANT
GOWN STRL REUS W/TWL LRG LVL3 (GOWN DISPOSABLE) ×4
GOWN STRL REUS W/TWL XL LVL3 (GOWN DISPOSABLE) ×2
HEMOSTAT POWDER KIT SURGIFOAM (HEMOSTASIS) ×2 IMPLANT
IV CATH AUTO 14GX1.75 SAFE ORG (IV SOLUTION) ×2 IMPLANT
KIT BASIN OR (CUSTOM PROCEDURE TRAY) ×2 IMPLANT
KIT TURNOVER KIT B (KITS) ×2 IMPLANT
NDL HYPO 18GX1.5 BLUNT FILL (NEEDLE) IMPLANT
NDL HYPO 25X1 1.5 SAFETY (NEEDLE) ×1 IMPLANT
NDL SPNL 18GX3.5 QUINCKE PK (NEEDLE) IMPLANT
NEEDLE HYPO 18GX1.5 BLUNT FILL (NEEDLE) ×2 IMPLANT
NEEDLE HYPO 25X1 1.5 SAFETY (NEEDLE) ×2 IMPLANT
NEEDLE SPNL 18GX3.5 QUINCKE PK (NEEDLE) IMPLANT
NS IRRIG 1000ML POUR BTL (IV SOLUTION) ×2 IMPLANT
PACK LAMINECTOMY NEURO (CUSTOM PROCEDURE TRAY) ×2 IMPLANT
PAD ARMBOARD 7.5X6 YLW CONV (MISCELLANEOUS) ×6 IMPLANT
SPIKE FLUID TRANSFER (MISCELLANEOUS) ×2 IMPLANT
SPONGE SURGIFOAM ABS GEL SZ50 (HEMOSTASIS) ×2 IMPLANT
SPONGE T-LAP 4X18 ~~LOC~~+RFID (SPONGE) IMPLANT
STRIP CLOSURE SKIN 1/2X4 (GAUZE/BANDAGES/DRESSINGS) IMPLANT
SUT MNCRL AB 4-0 PS2 18 (SUTURE) ×2 IMPLANT
SUT VIC AB 0 CT1 18XCR BRD8 (SUTURE) IMPLANT
SUT VIC AB 0 CT1 8-18 (SUTURE)
SUT VIC AB 2-0 CP2 18 (SUTURE) ×2 IMPLANT
SUT VIC AB 3-0 SH 8-18 (SUTURE) ×2 IMPLANT
SYR 3ML LL SCALE MARK (SYRINGE) ×1 IMPLANT
TOWEL GREEN STERILE (TOWEL DISPOSABLE) ×2 IMPLANT
TOWEL GREEN STERILE FF (TOWEL DISPOSABLE) ×2 IMPLANT
WATER STERILE IRR 1000ML POUR (IV SOLUTION) ×2 IMPLANT

## 2022-01-09 NOTE — Op Note (Signed)
PREOP DIAGNOSIS: Herniated nucleus pulposus at right L4-5 with radiculopathy  POSTOP DIAGNOSIS: Herniated nucleus pulposus at right L4-5 with radiculopathy  PROCEDURE: 1. Right L3-4 laminotomy, medial facetectomy for excision of herniated disc using tubular retractor system 2.  Use of microscope for microdissection  SURGEON: Dr. Hoyt Koch, MD  ASSISTANT: Docia Barrier, NP. Please note, there were no qualified trainees available to assist with the procedure.  An assistant was required for aid in retraction of the neural elements.   ANESTHESIA: General Endotracheal  EBL: 25 ml  SPECIMENS: None  DRAINS: None  COMPLICATIONS: none   CONDITION: Stable to PCAU  HISTORY: Chad Gallegos is a 88 y.o. male who initially presented to the outpatient clinic with lumbar radiculopathy in his right leg. MRI showed a right L4-5 foraminal disc herniation with superior extrusion with contact of the traversing nerve root and impingement of the exiting nerve root.  Of note, the patient had transitional anatomy and radiologist's numbering from the MRI was used, though the patient's radiculopathy was in the distribution of classic L3 nerve root pattern.  The patient had failed nonsurgical management.  Therefore, microdiscectomy was offered to the patient.  Risks, benefits, alternatives, and expected convalescence were discussed.  Risks discussed included, but were not limited to, bleeding, pain, infection, scar, recurrent disc, instability, CSF leak, weakness, numbness, paralysis, and death.  informed consent was obtained and the patient wished to proceed.  PROCEDURE IN DETAIL: The patient was brought to the operating room. After induction of general anesthesia, the patient was positioned on the operative table in the prone position on a Wilson frame with all pressure points meticulously padded. The skin of the low back was then prepped and draped in the usual sterile fashion.  Under fluoroscopy, the  correct levels were identified and marked out on the skin, and after timeout was conducted, the skin was infiltrated with local anesthetic.  Paramedian skin incision was then made sharply over the affected level and the subcutaneous tissue and fascia were incised.  Initial dilator was then passed to the interspace under fluoroscopic guidance.  The paraspinous muscle attachments were dissected from the right L4 lamina using the dilators.  Successive dilators were used until a 18 mm tubular retractor was placed under fluoroscopic guidance and locked in place with an attachment arm.  Microscope was then introduced into the field.  Small amount of remaining musculature was dissected from the lamina and the interspace was visualized as well.  The medial facet was also exposed.  High-speed drill was used to perform a laminotomy as well as a modest medial facetectomy.  The ligamentum flavum was then removed with rongeurs.  The thecal sac and traversing nerve root were identified.  The epidural disc space was dissected with a 4 Penfield.  Superiorly extruded disc fragment under the traversing nerve root was removed.    Large partially calcified fragment in the foramen was dissected from the exiting nerve root with ball ended nerve hook.  It was removed piecemeal as it was quite stiff.Following removal of the herniated disc, the nerve root appeared much more relaxed.  Woodson probe was used to ensure good decompression and there was no longer significant's impingement of the nerve roots.  Meticulous hemostasis was obtained.  The wound was irrigated thoroughly.  Depo-Medrol was then placed over the nerve root.  The tubular retractor was then withdrawn with hemostasis in the muscle obtained with bipolar.  The muscles were injected with half percent Marcaine.  The fascia was closed  with 0 Vicryl stitches.  The dermal layer was closed with 2-0 Vicryl stitches in buried interrupted fashion.  The skin was closed with 4-0 Monocryl  in subcuticular manner followed by Dermabond.  A sterile dressing was placed.  Patient was then flipped supine and extubated by the anesthesia service.  All counts were correct at the end of surgery.  No complications were noted.

## 2022-01-09 NOTE — Anesthesia Preprocedure Evaluation (Signed)
Anesthesia Evaluation  Patient identified by MRN, date of birth, ID band Patient awake    Reviewed: Allergy & Precautions, NPO status , Patient's Chart, lab work & pertinent test results  Airway Mallampati: II  TM Distance: >3 FB Neck ROM: Full    Dental  (+) Dental Advisory Given   Pulmonary neg pulmonary ROS,    breath sounds clear to auscultation       Cardiovascular negative cardio ROS   Rhythm:Regular Rate:Normal     Neuro/Psych negative neurological ROS     GI/Hepatic negative GI ROS, Neg liver ROS,   Endo/Other  negative endocrine ROS  Renal/GU negative Renal ROS     Musculoskeletal  (+) Arthritis ,   Abdominal   Peds  Hematology negative hematology ROS (+)   Anesthesia Other Findings   Reproductive/Obstetrics                             Lab Results  Component Value Date   WBC 6.7 12/24/2021   HGB 14.4 12/24/2021   HCT 42.6 12/24/2021   MCV 95.9 12/24/2021   PLT 172 12/24/2021   No results found for: "CREATININE", "BUN", "NA", "K", "CL", "CO2"  Anesthesia Physical Anesthesia Plan  ASA: 2  Anesthesia Plan: General   Post-op Pain Management: Tylenol PO (pre-op)* and Toradol IV (intra-op)*   Induction: Intravenous  PONV Risk Score and Plan: 2 and Dexamethasone, Ondansetron and Treatment may vary due to age or medical condition  Airway Management Planned: Oral ETT  Additional Equipment: None  Intra-op Plan:   Post-operative Plan: Extubation in OR  Informed Consent: I have reviewed the patients History and Physical, chart, labs and discussed the procedure including the risks, benefits and alternatives for the proposed anesthesia with the patient or authorized representative who has indicated his/her understanding and acceptance.     Dental advisory given  Plan Discussed with: CRNA  Anesthesia Plan Comments:         Anesthesia Quick Evaluation

## 2022-01-09 NOTE — H&P (Signed)
CC: right leg pain  HPI:     Patient is a 89 y.o. male presents with right leg radiculopathy refractory to nonoperative measures.  He was found to have a right L4-5 herniated disc.    Patient Active Problem List   Diagnosis Date Noted   Bunionette of right foot 08/05/2016   Abnormality of gait 08/05/2016   Pain in limb 01/01/2010   ACHILLES TENDINITIS, BILATERAL 06/14/2008   Past Medical History:  Diagnosis Date   Arthritis    lower back, hands   Prostate hypertrophy     Past Surgical History:  Procedure Laterality Date   AXILLARY LYMPH NODE BIOPSY  1993   EYE SURGERY     bilateral cataract    Medications Prior to Admission  Medication Sig Dispense Refill Last Dose   acetaminophen (TYLENOL) 500 MG tablet Take 1,000 mg by mouth every 6 (six) hours as needed for moderate pain.   01/08/2022   Ascorbic Acid (VITAMIN C) 1000 MG tablet Take 1,000 mg by mouth daily.   Past Week   Cholecalciferol (VITAMIN D3) 2000 units TABS Take 2,000 Units by mouth daily.   Past Week   diclofenac sodium (VOLTAREN) 1 % GEL Apply 1 application  topically 4 (four) times daily as needed (pain).   Past Week   dutasteride (AVODART) 0.5 MG capsule Take 0.5 mg by mouth daily.  2 01/09/2022   ibuprofen (ADVIL) 200 MG tablet Take 400 mg by mouth every 6 (six) hours as needed for moderate pain.   Past Week   Melatonin 5 MG CAPS Take 5 mg by mouth at bedtime.   01/08/2022   mirabegron ER (MYRBETRIQ) 50 MG TB24 tablet Take 50 mg by mouth daily.   01/09/2022   Multiple Vitamin (MULTIVITAMIN WITH MINERALS) TABS tablet Take 1 tablet by mouth daily.   Past Week   Omega-3 Fatty Acids (FISH OIL) 1000 MG CAPS Take 2,000 mg by mouth daily.   Past Week   rosuvastatin (CRESTOR) 10 MG tablet Take 10 mg by mouth daily.   01/09/2022   SUPER B COMPLEX/C PO Take 1 capsule by mouth daily.   Past Week   vitamin B-12 (CYANOCOBALAMIN) 1000 MCG tablet Take 1,000 mcg by mouth every other day.   Past Week   No Known Allergies  Social  History   Tobacco Use   Smoking status: Never   Smokeless tobacco: Not on file  Substance Use Topics   Alcohol use: Yes    Comment: 1-2 per day    History reviewed. No pertinent family history.   Review of Systems Pertinent items are noted in HPI.  Objective:   Patient Vitals for the past 8 hrs:  BP Temp Temp src Pulse Resp SpO2 Height Weight  01/09/22 0608 134/71 98.7 F (37.1 C) Oral (!) 50 16 100 % 5\' 7"  (1.702 m) 65.8 kg   No intake/output data recorded. No intake/output data recorded.      General : Alert, cooperative, no distress, appears stated age   Head:  Normocephalic/atraumatic    Eyes: PERRL, conjunctiva/corneas clear, EOM's intact. Fundi could not be visualized Neck: Supple Chest:  Respirations unlabored Chest wall: no tenderness or deformity Heart: Regular rate and rhythm Abdomen: Soft, nontender and nondistended Extremities: warm and well-perfused Skin: normal turgor, color and texture Neurologic:  Alert, oriented x 3.  Eyes open spontaneously. PERRL, EOMI, VFC, no facial droop. V1-3 intact.  No dysarthria, tongue protrusion symmetric.  CNII-XII intact. Normal strength, sensation and reflexes throughout.  No  pronator drift, full strength in legs.  + R SLR       Data ReviewCBC:  Lab Results  Component Value Date   WBC 6.7 12/24/2021   RBC 4.44 12/24/2021   BMP: No results found for: "GLUCOSE", "POTASSIUM", "CHLORIDE", "CO2", "BUN", "CREATININE", "CALCIUM" Radiology review:   See clinic note for details  Assessment:   Active Problems:   * No active hospital problems. * 88 yo M with R L4-5 HNP with superior migration  Plan:   - plan for R L4-5 microdiscectomy today

## 2022-01-09 NOTE — Anesthesia Postprocedure Evaluation (Signed)
Anesthesia Post Note  Patient: Chad Gallegos  Procedure(s) Performed: Minimally Invasive Surgery Microdiscectomy, Right, Lumbar four-five with metrix (Right)     Patient location during evaluation: PACU Anesthesia Type: General Level of consciousness: awake and alert Pain management: pain level controlled Vital Signs Assessment: post-procedure vital signs reviewed and stable Respiratory status: spontaneous breathing, nonlabored ventilation, respiratory function stable and patient connected to nasal cannula oxygen Cardiovascular status: blood pressure returned to baseline and stable Postop Assessment: no apparent nausea or vomiting Anesthetic complications: no   No notable events documented.  Last Vitals:  Vitals:   01/09/22 1440 01/09/22 1445  BP: (!) 147/70 (!) 148/66  Pulse:  65  Resp:  12  Temp:  36.5 C  SpO2:  92%    Last Pain:  Vitals:   01/09/22 1315  TempSrc:   PainSc: 0-No pain                 Kennieth Rad

## 2022-01-09 NOTE — Transfer of Care (Signed)
Immediate Anesthesia Transfer of Care Note  Patient: Chad Gallegos  Procedure(s) Performed: Minimally Invasive Surgery Microdiscectomy, Right, Lumbar four-five with metrix (Right)  Patient Location: PACU  Anesthesia Type:General  Level of Consciousness: drowsy and patient cooperative  Airway & Oxygen Therapy: Patient Spontanous Breathing  Post-op Assessment: Report given to RN and Post -op Vital signs reviewed and stable  Post vital signs: Reviewed and stable  Last Vitals:  Vitals Value Taken Time  BP 133/70 01/09/22 1315  Temp    Pulse 60 01/09/22 1318  Resp 13 01/09/22 1318  SpO2 95 % 01/09/22 1318  Vitals shown include unvalidated device data.  Last Pain:  Vitals:   01/09/22 0648  TempSrc:   PainSc: 0-No pain      Patients Stated Pain Goal: 0 (01/09/22 5859)  Complications: No notable events documented.

## 2022-01-09 NOTE — Anesthesia Procedure Notes (Signed)
Procedure Name: Intubation Date/Time: 01/09/2022 11:16 AM  Performed by: Thelma Comp, CRNAPre-anesthesia Checklist: Patient identified, Emergency Drugs available, Suction available and Patient being monitored Patient Re-evaluated:Patient Re-evaluated prior to induction Oxygen Delivery Method: Circle System Utilized Preoxygenation: Pre-oxygenation with 100% oxygen Induction Type: IV induction Ventilation: Mask ventilation without difficulty Laryngoscope Size: Mac and 4 Grade View: Grade I Tube type: Oral Tube size: 7.5 mm Number of attempts: 1 Airway Equipment and Method: Stylet and Oral airway Placement Confirmation: ETT inserted through vocal cords under direct vision, positive ETCO2 and breath sounds checked- equal and bilateral Secured at: 23 cm Tube secured with: Tape Dental Injury: Teeth and Oropharynx as per pre-operative assessment

## 2022-01-10 ENCOUNTER — Encounter (HOSPITAL_COMMUNITY): Payer: Self-pay | Admitting: Neurosurgery

## 2022-03-18 DIAGNOSIS — M8589 Other specified disorders of bone density and structure, multiple sites: Secondary | ICD-10-CM | POA: Diagnosis not present

## 2022-04-29 DIAGNOSIS — H43813 Vitreous degeneration, bilateral: Secondary | ICD-10-CM | POA: Diagnosis not present

## 2022-04-29 DIAGNOSIS — H31001 Unspecified chorioretinal scars, right eye: Secondary | ICD-10-CM | POA: Diagnosis not present

## 2022-04-29 DIAGNOSIS — H524 Presbyopia: Secondary | ICD-10-CM | POA: Diagnosis not present

## 2022-04-29 DIAGNOSIS — Z961 Presence of intraocular lens: Secondary | ICD-10-CM | POA: Diagnosis not present

## 2022-04-29 DIAGNOSIS — H52203 Unspecified astigmatism, bilateral: Secondary | ICD-10-CM | POA: Diagnosis not present

## 2022-06-02 IMAGING — DX DG WRIST COMPLETE 3+V*R*
4 series · 4 of 4 positions shown · non-contrast
Comparison: None.

CLINICAL DATA: Status post fall while running.

EXAM:
RIGHT WRIST - COMPLETE 3+ VIEW; RIGHT HAND - COMPLETE 3+ VIEW

[wrist pa]
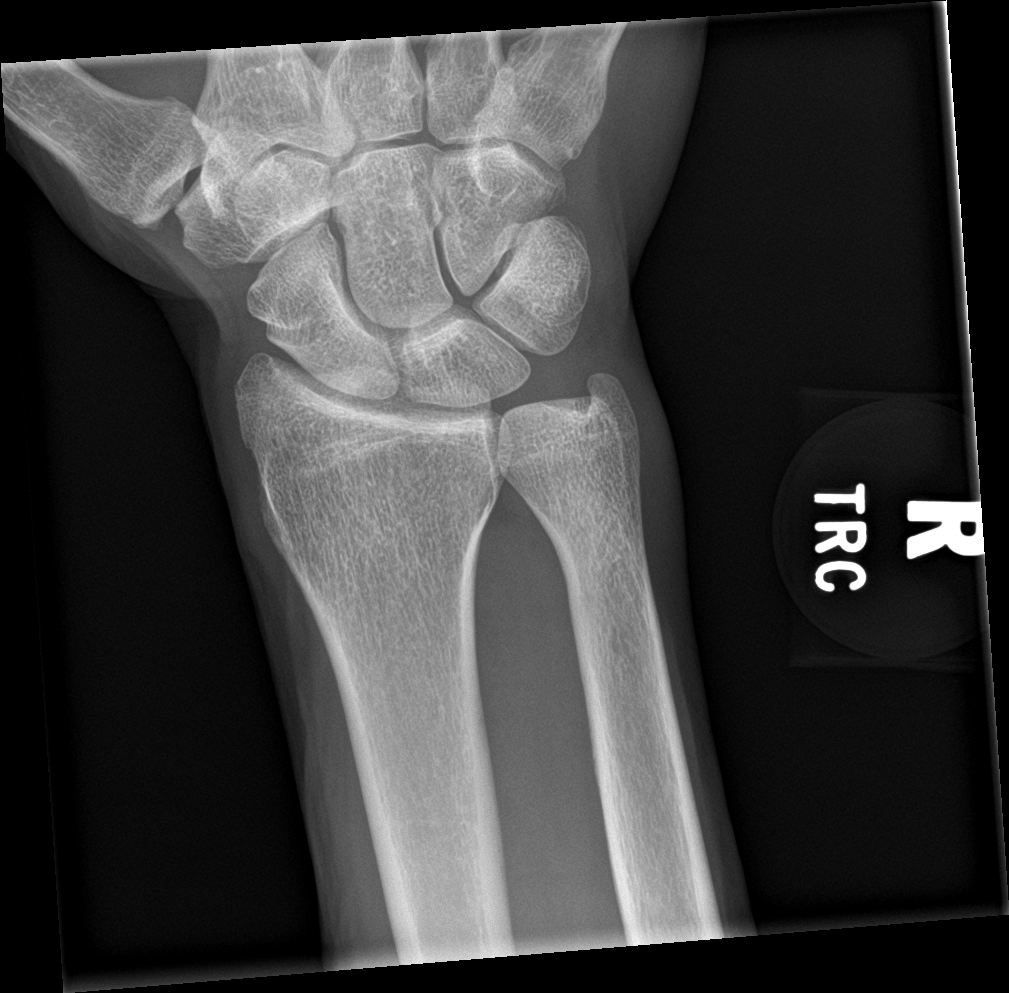

[wrist obl]
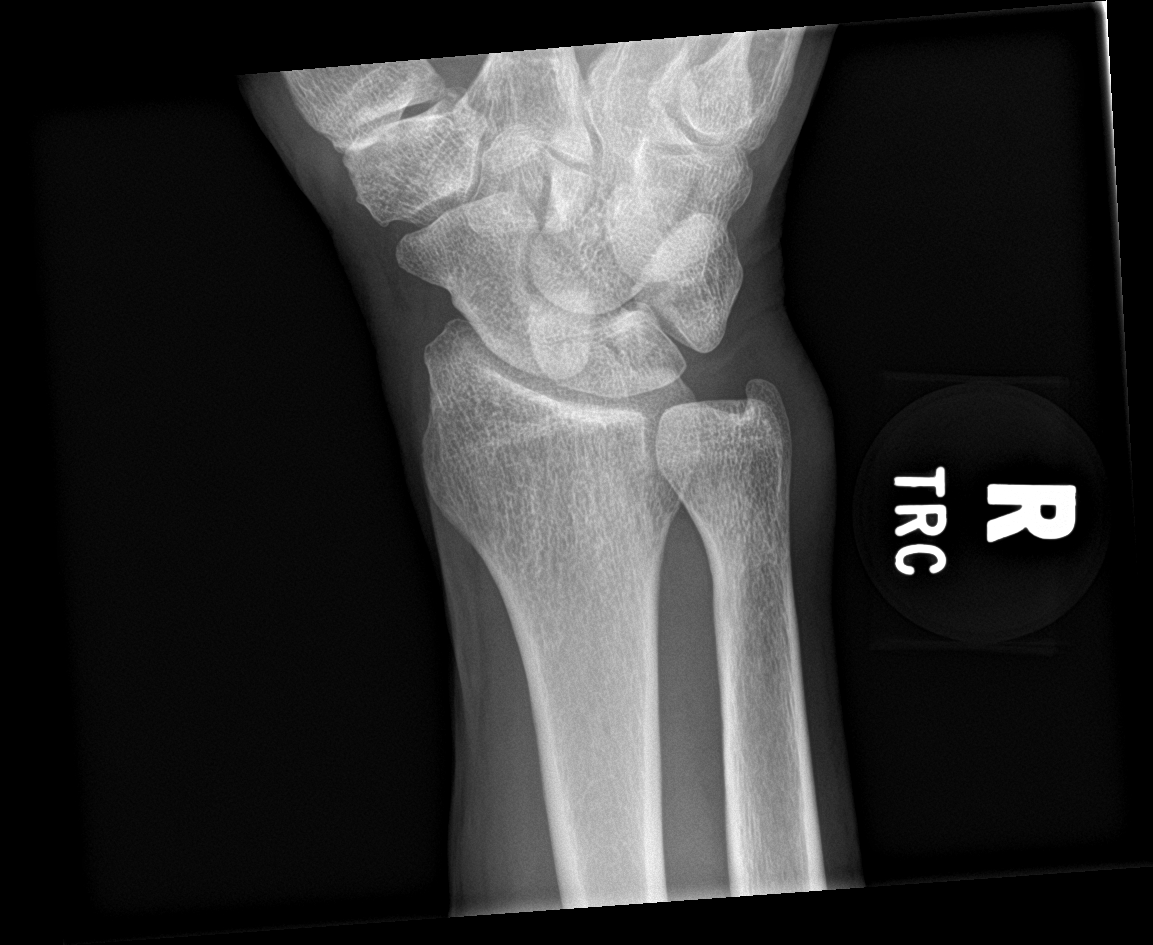

[wrist lat]
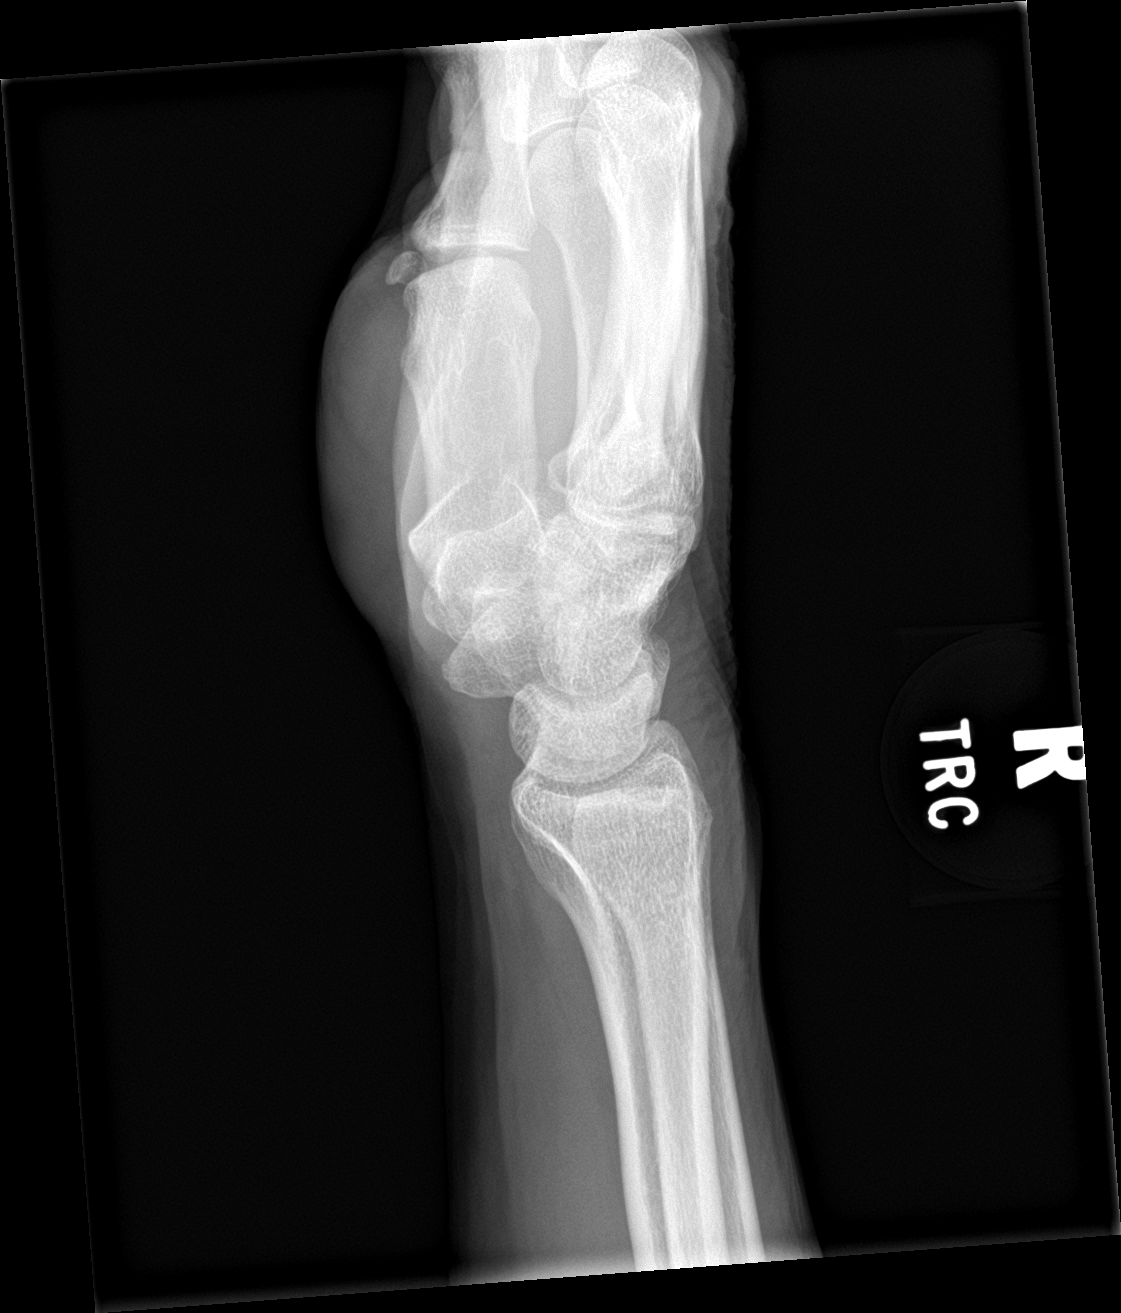

[wrist navicular]
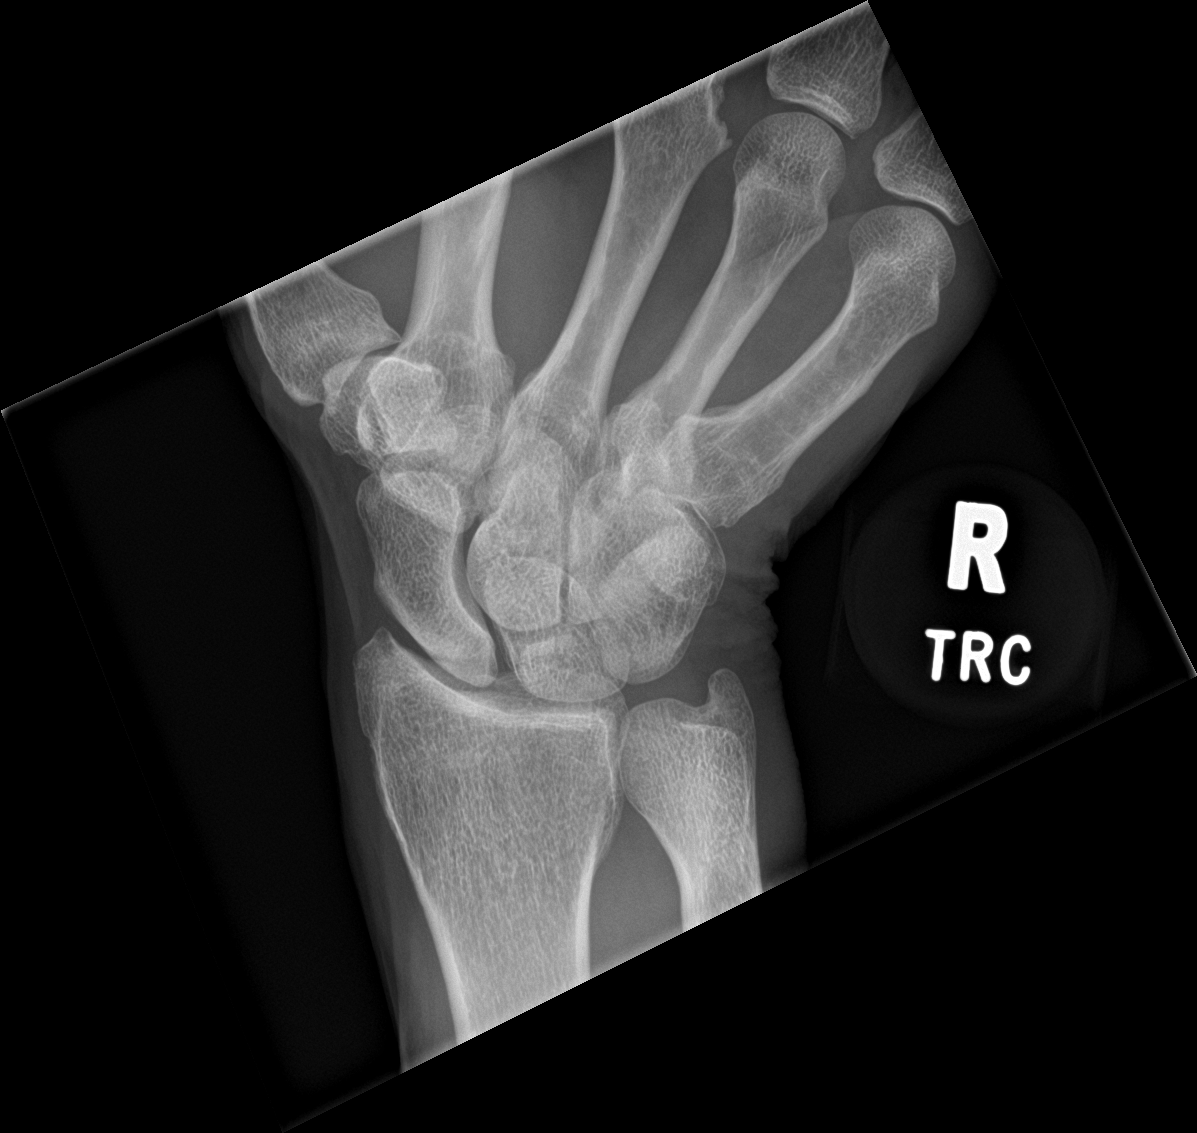

[4 of 4 positions shown; findings below may reference images not displayed]

FINDINGS: No acute fracture or dislocation. No aggressive osseous lesion.
Normal alignment. Old healed fracture deformity of the fifth
metacarpal. Mild osteoarthritis of the second and third D IP joints.

Soft tissue are unremarkable. No radiopaque foreign body or soft
tissue emphysema.
IMPRESSION: 1. No acute osseous injury of the right wrist and hand.

## 2022-06-02 IMAGING — CT CT CERVICAL SPINE W/O CM
3 of 4 series · 13 of 33 positions shown, 16 images · non-contrast
Comparison: None.

CLINICAL DATA: Head and neck trauma.  Status post fall.



[Series 3: c_spine 2.0 i30s 3 · axial · 0.37mm/px · z∈[+3,+103]mm · 5 of 76 slices shown, 7 images]
[im 13/76  soft-tissue]
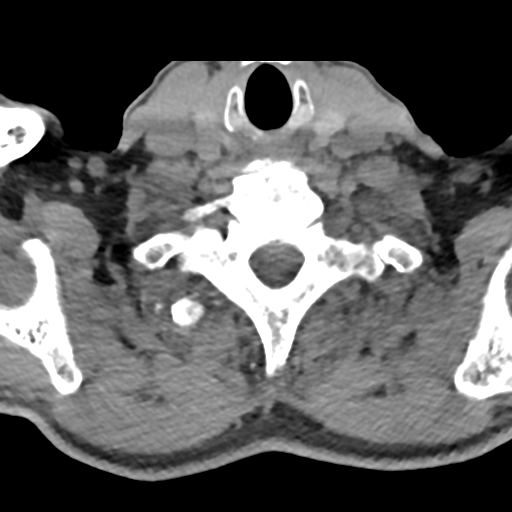
[im 13/76  bone]
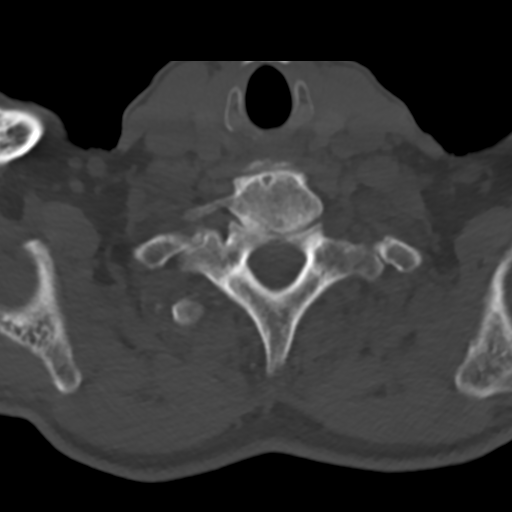
[im 26/76  bone]
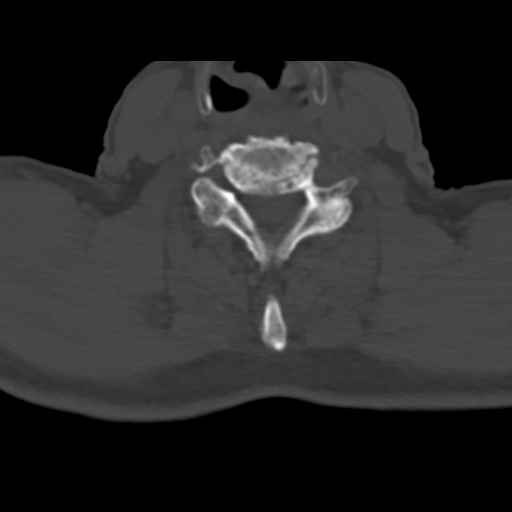
[im 38/76  bone]
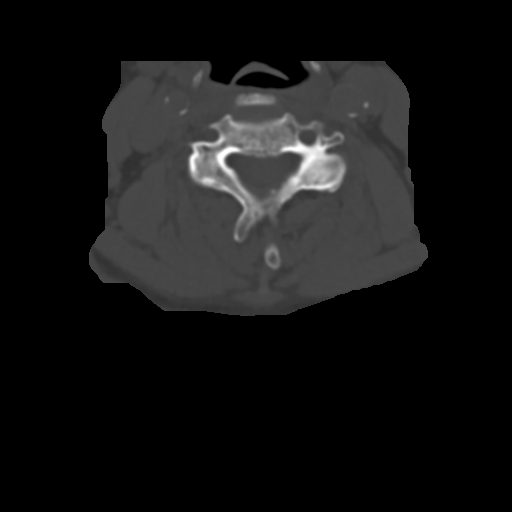
[im 51/76  bone]
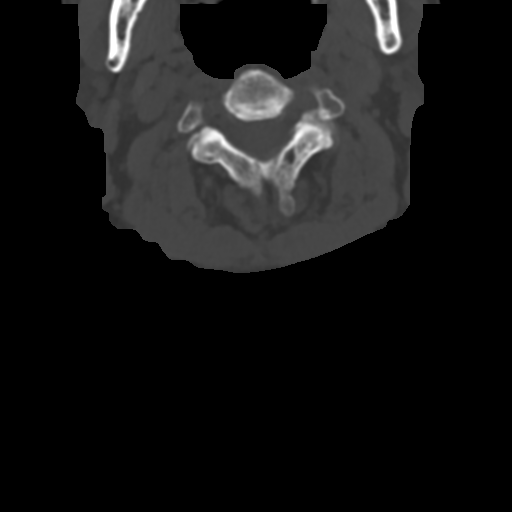
[im 63/76  soft-tissue]
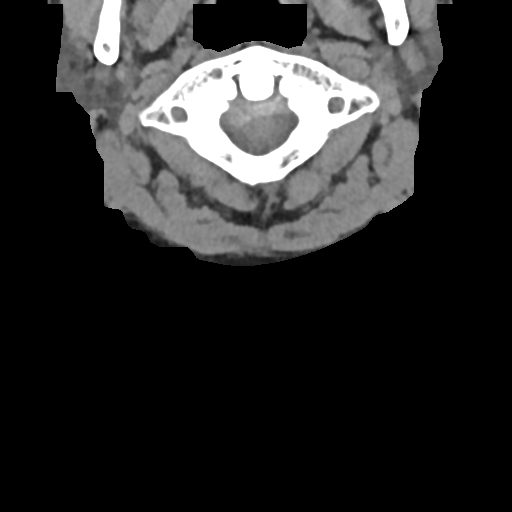
[im 63/76  bone]
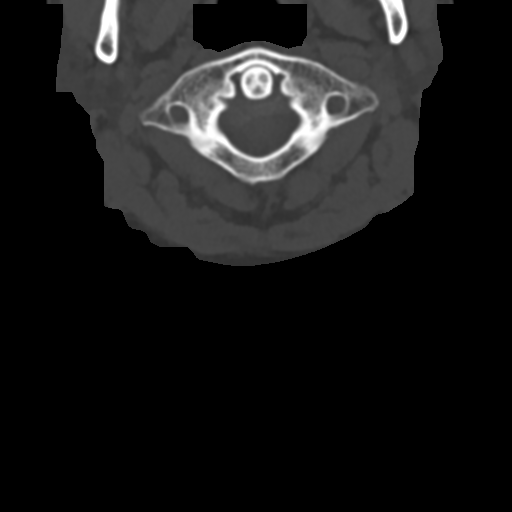

[Series 5: coronals · coronal · 0.23mm/px · 3 of 45 slices shown]
[im 9/45  bone]
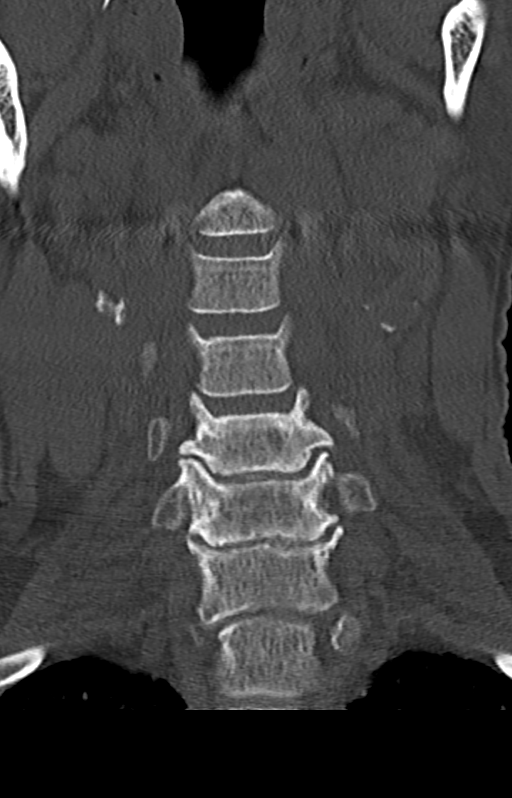
[im 18/45  bone]
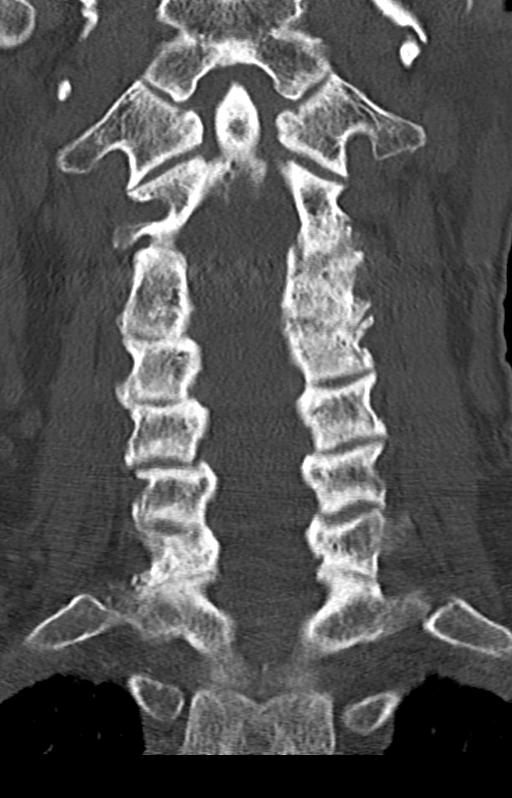
[im 27/45  bone]
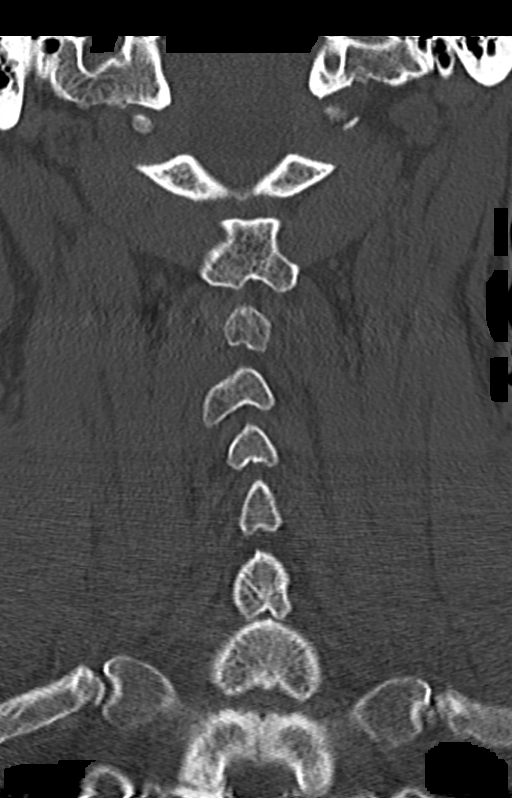

[Series 6: sagittals · sagittal · 0.31mm/px · 5 of 61 slices shown, 6 images]
[im 21/61  bone]
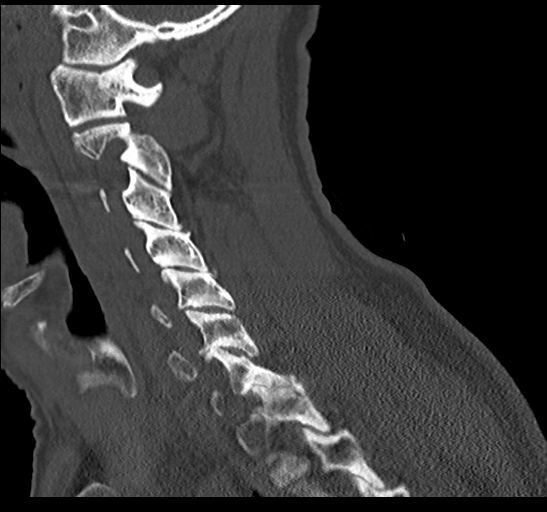
[im 26/61  bone]
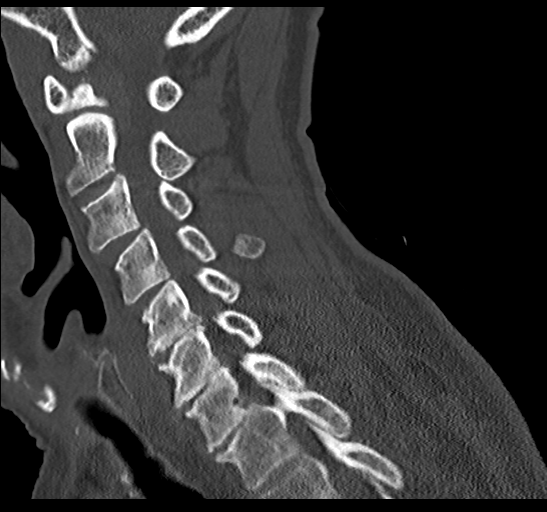
[im 31/61  soft-tissue]
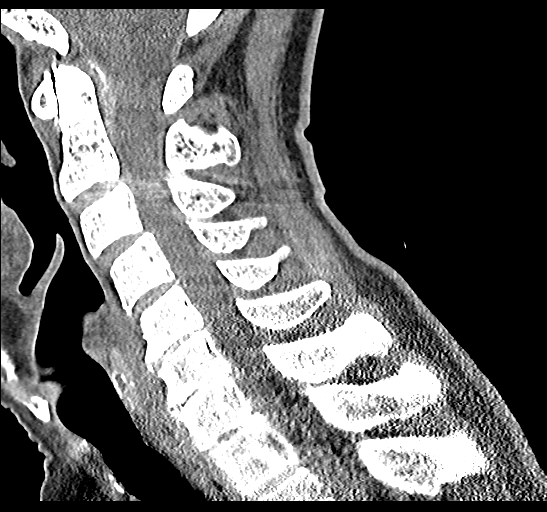
[im 31/61  bone]
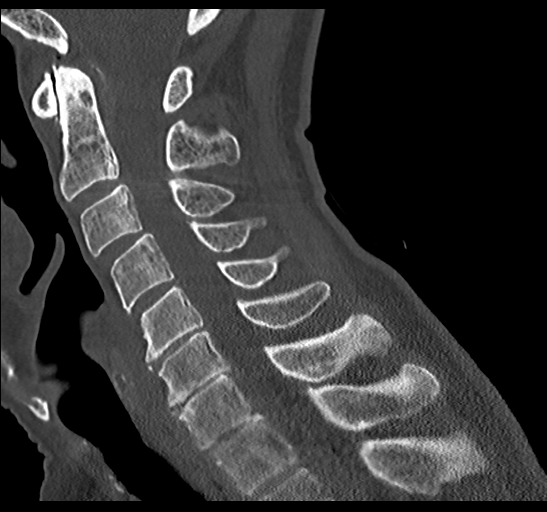
[im 36/61  bone]
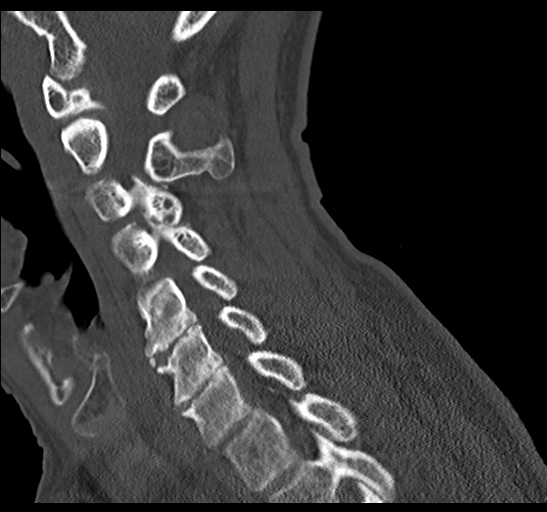
[im 41/61  bone]
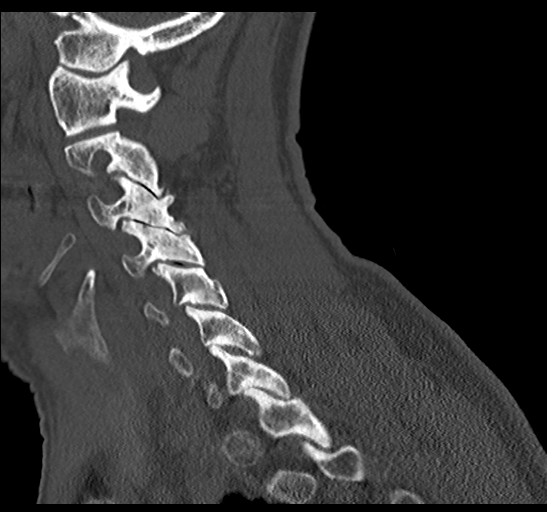

[13 of 33 positions shown; findings below may reference images not displayed]

FINDINGS: Brain: No evidence of acute infarction, hemorrhage, extra-axial
collection, ventriculomegaly, or mass effect. Generalized cerebral
atrophy. Periventricular white matter low attenuation likely
secondary to microangiopathy.

Vascular: Cerebrovascular atherosclerotic calcifications are noted.
No hyperdense vessels.

Skull: Negative for fracture or focal lesion.

Sinuses/Orbits: Visualized portions of the orbits are unremarkable.
Visualized portions of the paranasal sinuses are unremarkable.
Visualized portions of the mastoid air cells are unremarkable.

Other: Small left parietal sebaceous cyst. Large right frontal
scalp/supraorbital hematoma.

CT CERVICAL SPINE FINDINGS

Alignment: 2 mm anterolisthesis of C2 on C3 secondary to facet
disease.

Skull base and vertebrae: No acute fracture. No primary bone lesion
or focal pathologic process.

Soft tissues and spinal canal: No prevertebral fluid or swelling. No
visible canal hematoma.

Disc levels: Degenerative disease with disc height loss at C5-6,
C6-7 and C7-T1. Moderate left facet arthropathy at C2-3. At C3-4
there is mild right facet arthropathy, moderate left facet
arthropathy, and moderate left foraminal stenosis. At C4-5 there is
mild bilateral facet arthropathy with mild right foraminal stenosis.
At C5-6 there is mild bilateral facet arthropathy, bilateral
uncovertebral degenerative changes, bilateral foraminal stenosis. At
C6-7 there is bilateral uncovertebral degenerative changes with mild
bilateral foraminal stenosis. At C7-T1 there is mild bilateral facet
arthropathy.

Upper chest: Lung apices are clear.

Other: No fluid collection or hematoma.
IMPRESSION: 1. No acute intracranial pathology.
2. Large right frontal scalp/supraorbital hematoma.
3.  No acute osseous injury of the cervical spine.
4. Cervical spine spondylosis as described above.

## 2022-08-06 DIAGNOSIS — M25562 Pain in left knee: Secondary | ICD-10-CM | POA: Diagnosis not present

## 2022-08-15 DIAGNOSIS — Z125 Encounter for screening for malignant neoplasm of prostate: Secondary | ICD-10-CM | POA: Diagnosis not present

## 2022-08-15 DIAGNOSIS — R7989 Other specified abnormal findings of blood chemistry: Secondary | ICD-10-CM | POA: Diagnosis not present

## 2022-08-15 DIAGNOSIS — N529 Male erectile dysfunction, unspecified: Secondary | ICD-10-CM | POA: Diagnosis not present

## 2022-08-15 DIAGNOSIS — I2510 Atherosclerotic heart disease of native coronary artery without angina pectoris: Secondary | ICD-10-CM | POA: Diagnosis not present

## 2022-08-15 DIAGNOSIS — E559 Vitamin D deficiency, unspecified: Secondary | ICD-10-CM | POA: Diagnosis not present

## 2022-08-15 DIAGNOSIS — E785 Hyperlipidemia, unspecified: Secondary | ICD-10-CM | POA: Diagnosis not present

## 2022-08-22 DIAGNOSIS — L989 Disorder of the skin and subcutaneous tissue, unspecified: Secondary | ICD-10-CM | POA: Diagnosis not present

## 2022-08-22 DIAGNOSIS — N182 Chronic kidney disease, stage 2 (mild): Secondary | ICD-10-CM | POA: Diagnosis not present

## 2022-08-22 DIAGNOSIS — Z1339 Encounter for screening examination for other mental health and behavioral disorders: Secondary | ICD-10-CM | POA: Diagnosis not present

## 2022-08-22 DIAGNOSIS — E785 Hyperlipidemia, unspecified: Secondary | ICD-10-CM | POA: Diagnosis not present

## 2022-08-22 DIAGNOSIS — I2510 Atherosclerotic heart disease of native coronary artery without angina pectoris: Secondary | ICD-10-CM | POA: Diagnosis not present

## 2022-08-22 DIAGNOSIS — R911 Solitary pulmonary nodule: Secondary | ICD-10-CM | POA: Diagnosis not present

## 2022-08-22 DIAGNOSIS — Z8262 Family history of osteoporosis: Secondary | ICD-10-CM | POA: Diagnosis not present

## 2022-08-22 DIAGNOSIS — G4700 Insomnia, unspecified: Secondary | ICD-10-CM | POA: Diagnosis not present

## 2022-08-22 DIAGNOSIS — R82998 Other abnormal findings in urine: Secondary | ICD-10-CM | POA: Diagnosis not present

## 2022-08-22 DIAGNOSIS — Z1331 Encounter for screening for depression: Secondary | ICD-10-CM | POA: Diagnosis not present

## 2022-08-22 DIAGNOSIS — Z0000 Encounter for general adult medical examination without abnormal findings: Secondary | ICD-10-CM | POA: Diagnosis not present

## 2023-01-15 DIAGNOSIS — E785 Hyperlipidemia, unspecified: Secondary | ICD-10-CM | POA: Diagnosis not present

## 2023-01-15 DIAGNOSIS — N400 Enlarged prostate without lower urinary tract symptoms: Secondary | ICD-10-CM | POA: Diagnosis not present

## 2023-01-15 DIAGNOSIS — N3941 Urge incontinence: Secondary | ICD-10-CM | POA: Diagnosis not present

## 2023-01-15 DIAGNOSIS — Z833 Family history of diabetes mellitus: Secondary | ICD-10-CM | POA: Diagnosis not present

## 2023-01-15 DIAGNOSIS — I2510 Atherosclerotic heart disease of native coronary artery without angina pectoris: Secondary | ICD-10-CM | POA: Diagnosis not present

## 2023-01-15 DIAGNOSIS — Z87891 Personal history of nicotine dependence: Secondary | ICD-10-CM | POA: Diagnosis not present

## 2023-01-15 DIAGNOSIS — N529 Male erectile dysfunction, unspecified: Secondary | ICD-10-CM | POA: Diagnosis not present

## 2023-01-15 DIAGNOSIS — Z809 Family history of malignant neoplasm, unspecified: Secondary | ICD-10-CM | POA: Diagnosis not present

## 2023-01-15 DIAGNOSIS — Z818 Family history of other mental and behavioral disorders: Secondary | ICD-10-CM | POA: Diagnosis not present

## 2023-05-05 DIAGNOSIS — H524 Presbyopia: Secondary | ICD-10-CM | POA: Diagnosis not present

## 2023-05-05 DIAGNOSIS — H43813 Vitreous degeneration, bilateral: Secondary | ICD-10-CM | POA: Diagnosis not present

## 2023-05-05 DIAGNOSIS — H02834 Dermatochalasis of left upper eyelid: Secondary | ICD-10-CM | POA: Diagnosis not present

## 2023-05-05 DIAGNOSIS — H02831 Dermatochalasis of right upper eyelid: Secondary | ICD-10-CM | POA: Diagnosis not present

## 2023-05-05 DIAGNOSIS — H52203 Unspecified astigmatism, bilateral: Secondary | ICD-10-CM | POA: Diagnosis not present

## 2023-05-05 DIAGNOSIS — Z961 Presence of intraocular lens: Secondary | ICD-10-CM | POA: Diagnosis not present

## 2023-08-10 DIAGNOSIS — R350 Frequency of micturition: Secondary | ICD-10-CM | POA: Diagnosis not present

## 2023-08-10 DIAGNOSIS — M5136 Other intervertebral disc degeneration, lumbar region with discogenic back pain only: Secondary | ICD-10-CM | POA: Diagnosis not present

## 2023-08-10 DIAGNOSIS — N529 Male erectile dysfunction, unspecified: Secondary | ICD-10-CM | POA: Diagnosis not present

## 2023-11-04 DIAGNOSIS — M79641 Pain in right hand: Secondary | ICD-10-CM | POA: Diagnosis not present

## 2023-11-04 DIAGNOSIS — M1812 Unilateral primary osteoarthritis of first carpometacarpal joint, left hand: Secondary | ICD-10-CM | POA: Diagnosis not present

## 2023-11-06 DIAGNOSIS — E7849 Other hyperlipidemia: Secondary | ICD-10-CM | POA: Diagnosis not present

## 2023-11-06 DIAGNOSIS — N401 Enlarged prostate with lower urinary tract symptoms: Secondary | ICD-10-CM | POA: Diagnosis not present

## 2023-11-06 DIAGNOSIS — E559 Vitamin D deficiency, unspecified: Secondary | ICD-10-CM | POA: Diagnosis not present

## 2023-11-06 DIAGNOSIS — I2510 Atherosclerotic heart disease of native coronary artery without angina pectoris: Secondary | ICD-10-CM | POA: Diagnosis not present

## 2023-11-06 DIAGNOSIS — E785 Hyperlipidemia, unspecified: Secondary | ICD-10-CM | POA: Diagnosis not present

## 2023-11-06 DIAGNOSIS — N182 Chronic kidney disease, stage 2 (mild): Secondary | ICD-10-CM | POA: Diagnosis not present

## 2023-11-13 DIAGNOSIS — E785 Hyperlipidemia, unspecified: Secondary | ICD-10-CM | POA: Diagnosis not present

## 2023-11-13 DIAGNOSIS — M5030 Other cervical disc degeneration, unspecified cervical region: Secondary | ICD-10-CM | POA: Diagnosis not present

## 2023-11-13 DIAGNOSIS — I2510 Atherosclerotic heart disease of native coronary artery without angina pectoris: Secondary | ICD-10-CM | POA: Diagnosis not present

## 2023-11-13 DIAGNOSIS — Z1331 Encounter for screening for depression: Secondary | ICD-10-CM | POA: Diagnosis not present

## 2023-11-13 DIAGNOSIS — N182 Chronic kidney disease, stage 2 (mild): Secondary | ICD-10-CM | POA: Diagnosis not present

## 2023-11-13 DIAGNOSIS — G319 Degenerative disease of nervous system, unspecified: Secondary | ICD-10-CM | POA: Diagnosis not present

## 2023-11-13 DIAGNOSIS — M8580 Other specified disorders of bone density and structure, unspecified site: Secondary | ICD-10-CM | POA: Diagnosis not present

## 2023-11-13 DIAGNOSIS — Z0000 Encounter for general adult medical examination without abnormal findings: Secondary | ICD-10-CM | POA: Diagnosis not present

## 2023-11-13 DIAGNOSIS — R82998 Other abnormal findings in urine: Secondary | ICD-10-CM | POA: Diagnosis not present

## 2024-05-05 DIAGNOSIS — H43813 Vitreous degeneration, bilateral: Secondary | ICD-10-CM | POA: Diagnosis not present

## 2024-05-05 DIAGNOSIS — H04123 Dry eye syndrome of bilateral lacrimal glands: Secondary | ICD-10-CM | POA: Diagnosis not present

## 2024-05-05 DIAGNOSIS — H31001 Unspecified chorioretinal scars, right eye: Secondary | ICD-10-CM | POA: Diagnosis not present

## 2024-05-05 DIAGNOSIS — Z961 Presence of intraocular lens: Secondary | ICD-10-CM | POA: Diagnosis not present

## 2024-05-05 DIAGNOSIS — H52203 Unspecified astigmatism, bilateral: Secondary | ICD-10-CM | POA: Diagnosis not present

## 2024-05-19 DIAGNOSIS — M8580 Other specified disorders of bone density and structure, unspecified site: Secondary | ICD-10-CM | POA: Diagnosis not present

## 2024-05-19 DIAGNOSIS — Z833 Family history of diabetes mellitus: Secondary | ICD-10-CM | POA: Diagnosis not present

## 2024-05-19 DIAGNOSIS — M519 Unspecified thoracic, thoracolumbar and lumbosacral intervertebral disc disorder: Secondary | ICD-10-CM | POA: Diagnosis not present

## 2024-05-19 DIAGNOSIS — Z87891 Personal history of nicotine dependence: Secondary | ICD-10-CM | POA: Diagnosis not present

## 2024-05-19 DIAGNOSIS — I2510 Atherosclerotic heart disease of native coronary artery without angina pectoris: Secondary | ICD-10-CM | POA: Diagnosis not present

## 2024-05-19 DIAGNOSIS — G319 Degenerative disease of nervous system, unspecified: Secondary | ICD-10-CM | POA: Diagnosis not present

## 2024-05-19 DIAGNOSIS — N400 Enlarged prostate without lower urinary tract symptoms: Secondary | ICD-10-CM | POA: Diagnosis not present

## 2024-05-19 DIAGNOSIS — N3941 Urge incontinence: Secondary | ICD-10-CM | POA: Diagnosis not present

## 2024-05-19 DIAGNOSIS — E785 Hyperlipidemia, unspecified: Secondary | ICD-10-CM | POA: Diagnosis not present

## 2024-05-19 DIAGNOSIS — M8589 Other specified disorders of bone density and structure, multiple sites: Secondary | ICD-10-CM | POA: Diagnosis not present

## 2024-07-09 ENCOUNTER — Other Ambulatory Visit: Payer: Self-pay

## 2024-07-09 ENCOUNTER — Emergency Department (HOSPITAL_BASED_OUTPATIENT_CLINIC_OR_DEPARTMENT_OTHER)
Admission: EM | Admit: 2024-07-09 | Discharge: 2024-07-09 | Disposition: A | Discharge status: Home / Self Care | Attending: Emergency Medicine | Admitting: Emergency Medicine

## 2024-07-09 ENCOUNTER — Encounter (HOSPITAL_BASED_OUTPATIENT_CLINIC_OR_DEPARTMENT_OTHER): Payer: Self-pay | Admitting: Emergency Medicine

## 2024-07-09 DIAGNOSIS — R300 Dysuria: Secondary | ICD-10-CM | POA: Insufficient documentation

## 2024-07-09 DIAGNOSIS — R102 Pelvic and perineal pain unspecified side: Secondary | ICD-10-CM | POA: Insufficient documentation

## 2024-07-09 LAB — CBC WITH DIFFERENTIAL/PLATELET
Abs Immature Granulocytes: 0.01 K/uL (ref 0.00–0.07)
Basophils Absolute: 0.1 K/uL (ref 0.0–0.1)
Basophils Relative: 1 %
Eosinophils Absolute: 0.1 K/uL (ref 0.0–0.5)
Eosinophils Relative: 2 %
HCT: 42.7 % (ref 39.0–52.0)
Hemoglobin: 14.8 g/dL (ref 13.0–17.0)
Immature Granulocytes: 0 %
Lymphocytes Relative: 37 %
Lymphs Abs: 2.6 K/uL (ref 0.7–4.0)
MCH: 32.6 pg (ref 26.0–34.0)
MCHC: 34.7 g/dL (ref 30.0–36.0)
MCV: 94.1 fL (ref 80.0–100.0)
Monocytes Absolute: 0.6 K/uL (ref 0.1–1.0)
Monocytes Relative: 8 %
Neutro Abs: 3.7 K/uL (ref 1.7–7.7)
Neutrophils Relative %: 52 %
Platelets: 182 K/uL (ref 150–400)
RBC: 4.54 MIL/uL (ref 4.22–5.81)
RDW: 13.1 % (ref 11.5–15.5)
WBC: 7.1 K/uL (ref 4.0–10.5)
nRBC: 0 % (ref 0.0–0.2)

## 2024-07-09 LAB — BASIC METABOLIC PANEL WITH GFR
Anion gap: 8 (ref 5–15)
BUN: 19 mg/dL (ref 8–23)
CO2: 28 mmol/L (ref 22–32)
Calcium: 9.1 mg/dL (ref 8.9–10.3)
Chloride: 103 mmol/L (ref 98–111)
Creatinine, Ser: 0.98 mg/dL (ref 0.61–1.24)
GFR, Estimated: >60 mL/min
Glucose, Bld: 90 mg/dL (ref 70–99)
Potassium: 4.5 mmol/L (ref 3.5–5.1)
Sodium: 139 mmol/L (ref 135–145)

## 2024-07-09 LAB — URINALYSIS, W/ REFLEX TO CULTURE (INFECTION SUSPECTED)
Bilirubin Urine: NEGATIVE
Glucose, UA: NEGATIVE mg/dL
Hgb urine dipstick: NEGATIVE
Ketones, ur: NEGATIVE mg/dL
Leukocytes,Ua: NEGATIVE
Nitrite: NEGATIVE
Protein, ur: NEGATIVE mg/dL
RBC / HPF: NONE SEEN RBC/hpf (ref 0–5)
Specific Gravity, Urine: 1.01 (ref 1.005–1.030)
pH: 5.5 (ref 5.0–8.0)

## 2024-07-09 MED ORDER — ACETAMINOPHEN 500 MG PO TABS
1000.0000 mg | ORAL_TABLET | Freq: Once | ORAL | Status: AC
  Administered 2024-07-09: 1000 mg via ORAL
  Filled 2024-07-09: qty 2

## 2024-07-09 NOTE — ED Provider Triage Note (Signed)
 Emergency Medicine Provider Triage Evaluation Note  Chad Gallegos , a 89 y.o. male  was evaluated in triage.  Pt complains of GU discomfort.  Reports that his pain has been intermittent for the past 3 days.  Reports he went on a 20 mile long bike ride on 1/1 and since then developed pain in his penis and just behind it, clarified if patient means suprapubic area or prostate, patient believes that it is his prostate.  Reports that he has had some dysuria, denies urgency, frequency, abdominal pain, fever, chills, nausea, vomiting.  Does report that he took OTC pain medication this morning which did provide partial relief.  Review of Systems  Positive: Dysuria Negative: Fever  Physical Exam  BP 123/85 (BP Location: Right Arm)   Pulse (!) 51   Temp (!) 97.5 F (36.4 C) (Oral)   Resp 17   Ht 5' 7 (1.702 m)   Wt 66.7 kg   SpO2 99%   BMI 23.02 kg/m  Gen:   Awake, no distress   Resp:  Normal effort  MSK:   Moves extremities without difficulty   Medical Decision Making  Medically screening exam initiated at 9:40 AM.  Appropriate orders placed.  Chad Gallegos was informed that the remainder of the evaluation will be completed by another provider, this initial triage assessment does not replace that evaluation, and the importance of remaining in the ED until their evaluation is complete.   Chad Jerilynn RAMAN, MD 07/09/24 [phone]

## 2024-07-09 NOTE — ED Provider Notes (Signed)
 "  EMERGENCY DEPARTMENT AT [facility] Provider Note   CSN: [id number] Arrival date & time: 07/09/24  9082     History Chief Complaint  Patient presents with   Dysuria    HPI: Chad Gallegos is a 89 y.o. male with history pertinent for prostatic hypertrophy who presents complaining of GU pain. Patient arrived via POV.  History provided by patient.  No interpreter required during this encounter.  Reports that he has had intermittent GU comfort for several days.  Reports that he went on a 20 mile long bike ride on 1/1 and since then developed pain in his penis and just behind it, clarified if patient means suprapubic area or prostate, patient believes that it is his prostate.  Reports that he has had some dysuria, denies urgency, frequency, abdominal pain, fever, chills, nausea, vomiting, hematuria.  Does report that he took OTC pain medication this morning which did provide partial relief.  Denies any concerns for sexually transmitted infection, reports that he has not been sexually active, and his only partner in recent history is his wife.  Patient's recorded medical, surgical, social, medication list and allergies were reviewed in the Snapshot window as part of the initial history.   Prior to Admission medications  Medication Sig Start Date End Date Taking? Authorizing Provider  acetaminophen  (TYLENOL ) 500 MG tablet Take 1,000 mg by mouth every 6 (six) hours as needed for moderate pain.    [provider]  Ascorbic Acid (VITAMIN C) 1000 MG tablet Take 1,000 mg by mouth daily.    [provider]  Cholecalciferol (VITAMIN D3) 2000 units TABS Take 2,000 Units by mouth daily.    [provider]  cyclobenzaprine  (FLEXERIL ) 10 MG tablet Take 1 tablet (10 mg total) by mouth 3 (three) times daily as needed for muscle spasms. 01/09/22   Thomas, Jonathan G, MD  diclofenac sodium (VOLTAREN) 1 % GEL Apply 1 application  topically 4 (four) times daily as  needed (pain).    [provider]  dutasteride (AVODART) 0.5 MG capsule Take 0.5 mg by mouth daily. 05/21/16   [provider]  ibuprofen (ADVIL) 200 MG tablet Take 400 mg by mouth every 6 (six) hours as needed for moderate pain.    [provider]  Melatonin 5 MG CAPS Take 5 mg by mouth at bedtime.    [provider]  mirabegron ER (MYRBETRIQ) 50 MG TB24 tablet Take 50 mg by mouth daily.    [provider]  Multiple Vitamin (MULTIVITAMIN WITH MINERALS) TABS tablet Take 1 tablet by mouth daily.    [provider]  Omega-3 Fatty Acids (FISH OIL) 1000 MG CAPS Take 2,000 mg by mouth daily.    [provider]  rosuvastatin (CRESTOR) 10 MG tablet Take 10 mg by mouth daily.    [provider]  SUPER B COMPLEX/C PO Take 1 capsule by mouth daily.    [provider]  vitamin B-12 (CYANOCOBALAMIN) 1000 MCG tablet Take 1,000 mcg by mouth every other day.    [provider]     Allergies: Patient has no known allergies.   Review of Systems   ROS as per HPI  Physical Exam Updated Vital Signs BP 123/85 (BP Location: Right Arm)   Pulse (!) 51   Temp (!) 97.5 F (36.4 C) (Oral)   Resp 17   Ht 5' 7 (1.702 m)   Wt 66.7 kg   SpO2 99%   BMI 23.02 kg/m  Physical Exam Vitals and nursing note reviewed.  Constitutional:      General: He is not in acute distress.    Appearance: He is well-developed.  HENT:     Head: Normocephalic and atraumatic.  Eyes:     Conjunctiva/sclera: Conjunctivae normal.  Cardiovascular:     Rate and Rhythm: Normal rate and regular rhythm.     Heart sounds: No murmur heard. Pulmonary:     Effort: Pulmonary effort is normal. No respiratory distress.     Breath sounds: Normal breath sounds.  Abdominal:     Palpations: Abdomen is soft.     Tenderness: There is no abdominal tenderness. There is no right CVA tenderness, left CVA tenderness, guarding or rebound.  Genitourinary:     Comments: Buried penis, otherwise WNL, no tenderness, discharge, erythema.  Testicles nonswollen, nontender bilaterally, present cremasteric reflex.  Prostate appropriately firm, no palpable masses, no fluctuance, no prostatic tenderness to palpation Musculoskeletal:        General: No swelling.     Cervical back: Neck supple. No bony tenderness.     Thoracic back: No bony tenderness.     Lumbar back: No bony tenderness.  Skin:    General: Skin is warm and dry.     Capillary Refill: Capillary refill takes less than 2 seconds.  Neurological:     Mental Status: He is alert.  Psychiatric:        Mood and Affect: Mood normal.     ED Course/ Medical Decision Making/ A&P    Procedures Procedures   Medications Ordered in ED Medications  acetaminophen  (TYLENOL ) tablet 1,000 mg (1,000 mg Oral Given 07/09/24 1008)    Medical Decision Making:   Chad Gallegos is a 89 y.o. male who presents for intermittent pain in the abdomen and penis as per above.  Physical exam is pertinent for no CVA tenderness or abdominal tenderness, reassuring GU exam, no prostate tenderness.  The differential includes but is not limited to saddle pain, MSK pain, prostatitis, cystitis, UTI.  Independent historian: None  External data reviewed: Labs: reviewed prior labs for baseline  Initial Plan:  Screening labs including CBC and Metabolic panel to evaluate for infectious or metabolic etiology of disease.  Urinalysis with reflex culture ordered to evaluate for UTI or relevant urologic/nephrologic pathology.  Objective evaluation as below reviewed   Labs: Ordered, Independent interpretation, and Details: CBC without leukocytosis, anemia, thrombocytopenia.  BMP without AKI or emergent electrolyte derangement.  UA with bacteriuria without LE or nitrites, unconvincing for AKI  Radiology: Not indicated No results found.  EKG/Medicine tests: Not indicated EKG Interpretation:    Interventions: Tylenol   See the  EMR for full details regarding lab and imaging results.  Patient reassuring on exam, does not currently have pain, was able to provide urine sample, there is evidence of bacteriuria, patient reports that he did not wipe to provide clean-catch sample, rather he just urinated into cup, thus in the absence of LE or nitrites, will not empirically antibiosis at this time.  No evidence of prostatitis on exam, thus feel that pain is most consistent with musculoskeletal/subtle pain from prolonged bike ride, recommended Tylenol , ibuprofen, follow-up with PCP.  Patient comfortable with this plan.  Presentation is most consistent with acute uncomplicated illness  Discussion of management or test interpretations with external provider(s): Not indicated  Risk Drugs:OTC drugs  Disposition: DISCHARGE: I believe that the patient is safe for discharge home with outpatient follow-up. Patient was informed of all pertinent physical exam,  laboratory, and imaging findings.  Patient's suspected etiology of their symptom presentation was discussed with the patient and all questions were answered. We discussed following up with PCP. I provided thorough ED return precautions. The patient feels safe and comfortable with this plan.  MDM generated using voice dictation software and may contain dictation errors.  Please contact me for any clarification or with any questions.  Clinical Impression:  1. Pelvic pain      Discharge   Final Clinical Impression(s) / ED Diagnoses Final diagnoses:  Pelvic pain    Rx / DC Orders ED Discharge Orders     None        [name], MD 07/09/24 1229  "

## 2024-07-09 NOTE — ED Triage Notes (Signed)
 Pt reports he has been having ongoing intermittent dysuria and penile/prostate pain since Thursday, denies hematuria, back pain, n/v

## 2024-07-09 NOTE — ED Notes (Signed)
 ED Provider at bedside.

## 2024-07-09 NOTE — Discharge Instructions (Signed)
 Chad Gallegos  Thank you for allowing us  to take care of you today.  You came to the Emergency Department today because you are having pain in your pelvis and penis after a recent long bike ride and that got better after taking Tylenol  and ibuprofen.  Here in the emergency department your blood work did not show any elevation of your kidney numbers nor your white blood cell count.  Your urine does not show any inflammatory markers concerning for infection.  There is a small amount of bacteria in your urine, which sometimes can be colonization of your bladder, it also could potentially be where there was some bacteria on the tip of your penis when he provided the urine sample.  Given the lack of inflammatory markers, we will not start you on antibiotics at this time, we will send a urine culture to the lab and if this is positive for infection on the culture we will call you and send you in antibiotics.  You do not have any tenderness or bogginess to your prostate, therefore you are unlikely to have an infection or inflammation of your prostate.  Most likely your pain is caused by subtle pain from your long bike ride, we recommend Tylenol  and ibuprofen as needed for discomfort, and following up with your primary care doctor.   To-Do: 1. Please follow-up with your primary doctor within 1 - 2 weeks / as soon as possible.    Please return to the Emergency Department or call 911 if you experience have worsening of your symptoms, or do not get better, chest pain, shortness of breath, severe or significantly worsening pain, high fever, severe confusion, pass out or have any reason to think that you need emergency medical care.   We hope you feel better soon.   Mitzie Later, MD Department of Emergency Medicine [facility]
